# Patient Record
Sex: Male | Born: 1980
Health system: Southern US, Community
[De-identification: ages and names within clinical notes are randomized; demographics above are authoritative.]

---

## 2013-04-26 ENCOUNTER — Encounter (HOSPITAL_BASED_OUTPATIENT_CLINIC_OR_DEPARTMENT_OTHER): Payer: Self-pay | Admitting: *Deleted

## 2013-04-26 ENCOUNTER — Emergency Department (HOSPITAL_BASED_OUTPATIENT_CLINIC_OR_DEPARTMENT_OTHER)
Admission: EM | Admit: 2013-04-26 | Discharge: 2013-04-26 | Disposition: A | Discharge status: Home / Self Care | Payer: Self-pay | Attending: Emergency Medicine | Admitting: Emergency Medicine

## 2013-04-26 DIAGNOSIS — S81809A Unspecified open wound, unspecified lower leg, initial encounter: Secondary | ICD-10-CM | POA: Insufficient documentation

## 2013-04-26 DIAGNOSIS — S81811A Laceration without foreign body, right lower leg, initial encounter: Secondary | ICD-10-CM

## 2013-04-26 DIAGNOSIS — F17200 Nicotine dependence, unspecified, uncomplicated: Secondary | ICD-10-CM | POA: Insufficient documentation

## 2013-04-26 DIAGNOSIS — Y9289 Other specified places as the place of occurrence of the external cause: Secondary | ICD-10-CM | POA: Insufficient documentation

## 2013-04-26 DIAGNOSIS — S81009A Unspecified open wound, unspecified knee, initial encounter: Secondary | ICD-10-CM | POA: Insufficient documentation

## 2013-04-26 DIAGNOSIS — Y9389 Activity, other specified: Secondary | ICD-10-CM | POA: Insufficient documentation

## 2013-04-26 DIAGNOSIS — W2209XA Striking against other stationary object, initial encounter: Secondary | ICD-10-CM | POA: Insufficient documentation

## 2013-04-26 MED ORDER — CEPHALEXIN 250 MG PO CAPS
500.0000 mg | ORAL_CAPSULE | Freq: Once | ORAL | Status: AC
  Administered 2013-04-26: 500 mg via ORAL
  Filled 2013-04-26: qty 2

## 2013-04-26 MED ORDER — CEPHALEXIN 500 MG PO CAPS: 500.0000 mg | ORAL_CAPSULE | Freq: Four times a day (QID) | ORAL | Status: DC

## 2013-04-26 NOTE — ED Notes (Signed)
Pt states he cut his right lower leg on a pole that he was putting down in the ground for his dogs around 12 today. Bleeding controlled.

## 2013-04-26 NOTE — ED Provider Notes (Signed)
History    This chart was scribed for Charles B. Bernette Mayers, MD by Quintella Reichert, ED scribe.  This patient was seen in room MH01/MH01 and the patient's care was started at 9:38 PM.   CSN: [id number]  Arrival date & time 04/26/13  2056      Chief Complaint  Patient presents with  . Laceration     The history is provided by the patient. No language interpreter was used.    HPI Comments: Bill Crawford is a 32 y.o. male who presents to the Emergency Department complaining of a laceration to the right lower leg that pt sustained 7 hours ago when the area was cut on a pole that he was putting in the ground.  Pt states that he washed the wound and applied peroxide and antibiotic ointment to the wound shortly after it occurred.  He reports that the wound was bleeding after the injury but presently bleeding is controlled.  He reports mild pain in the area.  Pt denies numbness or tingling in area, discharge from the area, discoloration, fever, chills, weakness, or any other associated symptoms.  He states that his last tetanus shot was in 2012 or 2013.   History reviewed. No pertinent past medical history.  History reviewed. No pertinent past surgical history.  History reviewed. No pertinent family history.  History  Substance Use Topics  . Smoking status: Current Every Day Smoker  . Smokeless tobacco: Not on file  . Alcohol Use: No      Review of Systems A complete 10 system review of systems was obtained and all systems are negative except as noted in the HPI and PMH.    Allergies  Review of patient's allergies indicates no known allergies.  Home Medications  No current outpatient prescriptions on file.  BP 146/63  Pulse 96  Temp(Src) 98.7 F (37.1 C) (Oral)  Resp 18  Ht 6\' 3"  (1.905 m)  Wt 200 lb (90.719 kg)  BMI 25 kg/m2  SpO2 98%  Physical Exam  Nursing note and vitals reviewed. Constitutional: He is oriented to person, place, and time. He appears well-developed  and well-nourished.  HENT:  Head: Normocephalic and atraumatic.  Eyes: Conjunctivae and EOM are normal.  Neck: Normal range of motion. Neck supple.  Cardiovascular: Normal rate and regular rhythm.   Pulmonary/Chest: Effort normal. No respiratory distress.  Neurological: He is alert and oriented to person, place, and time. No cranial nerve deficit.  Skin: Skin is warm and dry. No rash noted.  4-cm stellate laceration to right lower leg  Psychiatric: He has a normal mood and affect. His behavior is normal.    ED Course  Procedures (including critical care time)  DIAGNOSTIC STUDIES: Oxygen Saturation is 98% on room air, normal by my interpretation.    COORDINATION OF CARE: 9:42 PM-Discussed treatment plan which includes laceration repair with pt at bedside and pt agreed to plan.    LACERATION REPAIR PROCEDURE NOTE The patient's identification was confirmed and consent was obtained. This procedure was performed by Leonette Most B. Bernette Mayers, MD at 9:43 PM. Site: Right lower leg Sterile procedures observed Anesthetic used (type and amt): Lidocaine 2% w/o epinephrine Suture type/size:4-0 nylon Length:4 cm # of Sutures: 5 Technique: Loose approximation Complexity: Complex stellate Antibx ointment applied Tetanus UTD Site anesthetized, irrigated with NS, explored without evidence of foreign body, wound well approximated, site covered with dry, sterile dressing.  Patient tolerated procedure well without complications. Instructions for care discussed verbally and patient provided with additional written  instructions for homecare and f/u.  9:52 PM: Explained home treatment plan including keeping area clean and covered, applying antibiotioc ointment, and F/U in 10-14 days at urgent care, primary care, or ED to have sutures removed.  Advised pt not to apply peroxide.  Pt expressed understanding and agreed to plan.  Labs Reviewed - No data to display No results found.   1. Laceration of  right lower leg, initial encounter       MDM  Stellate laceration to R leg, >6hrs duration.Loosely approximated, start Abx. Advised to return for signs of infection. Suture removal in 10-14 days.       I personally performed the services described in this documentation, which was scribed in my presence. The recorded information has been reviewed and is accurate.     Charles B. Bernette Mayers, MD 04/26/13 2202

## 2013-04-26 NOTE — ED Notes (Signed)
MD at bedside. 

## 2013-05-07 ENCOUNTER — Encounter (HOSPITAL_BASED_OUTPATIENT_CLINIC_OR_DEPARTMENT_OTHER): Payer: Self-pay | Admitting: *Deleted

## 2013-05-07 ENCOUNTER — Emergency Department (HOSPITAL_BASED_OUTPATIENT_CLINIC_OR_DEPARTMENT_OTHER)
Admission: EM | Admit: 2013-05-07 | Discharge: 2013-05-07 | Disposition: A | Discharge status: Home / Self Care | Payer: Self-pay | Attending: Emergency Medicine | Admitting: Emergency Medicine

## 2013-05-07 DIAGNOSIS — F17200 Nicotine dependence, unspecified, uncomplicated: Secondary | ICD-10-CM | POA: Insufficient documentation

## 2013-05-07 DIAGNOSIS — Z4802 Encounter for removal of sutures: Secondary | ICD-10-CM

## 2013-05-07 NOTE — ED Provider Notes (Signed)
History     CSN: [id number]  Arrival date & time 05/07/13  1556   First MD Initiated Contact with Patient 05/07/13 [phone]      Chief Complaint  Patient presents with  . Suture / Staple Removal    (Consider location/radiation/quality/duration/timing/severity/associated sxs/prior treatment) HPI Comments: Patient presents for suture removal from right leg. Sutures placed on May 17. Denies any problems. No bleeding or drainage. No fevers or vomiting.  The history is provided by the patient.    History reviewed. No pertinent past medical history.  History reviewed. No pertinent past surgical history.  History reviewed. No pertinent family history.  History  Substance Use Topics  . Smoking status: Current Every Day Smoker  . Smokeless tobacco: Not on file  . Alcohol Use: No      Review of Systems  Skin: Positive for wound.  A complete 10 system review of systems was obtained and all systems are negative except as noted in the HPI and PMH.    Allergies  Review of patient's allergies indicates no known allergies.  Home Medications   Current Outpatient Rx  Name  Route  Sig  Dispense  Refill  . cephALEXin (KEFLEX) 500 MG capsule   Oral   Take 1 capsule (500 mg total) by mouth 4 (four) times daily.   28 capsule   0     BP 127/67  Pulse 82  Temp(Src) 98.1 F (36.7 C)  Resp 16  SpO2 98%  Physical Exam  Constitutional: He is oriented to person, place, and time. He appears well-developed and well-nourished. No distress.  HENT:  Head: Normocephalic and atraumatic.  Mouth/Throat: Oropharynx is clear and moist. No oropharyngeal exudate.  Eyes: Conjunctivae and EOM are normal. Pupils are equal, round, and reactive to light.  Neck: Normal range of motion. Neck supple.  Cardiovascular: Normal rate, regular rhythm and normal heart sounds.   Pulmonary/Chest: Effort normal and breath sounds normal. No respiratory distress.  Abdominal: Soft. There is no tenderness. There  is no rebound and no guarding.  Musculoskeletal: Normal range of motion. He exhibits no edema and no tenderness.  Neurological: He is alert and oriented to person, place, and time. No cranial nerve deficit. He exhibits normal muscle tone. Coordination normal.  Skin: Skin is warm.  Scabbed laceration to right shin. No fluctuance or erythema. No drainage. Sutures are removed by nursing staff    ED Course  Procedures (including critical care time)  Labs Reviewed - No data to display No results found.   1. Visit for suture removal       MDM  Suture removal without complication. Patient informed that he will have a scar.       Glynn Octave, MD 05/07/13 [phone]

## 2013-05-07 NOTE — ED Notes (Signed)
Pt here for suture removal last seen 5/17

## 2013-05-13 ENCOUNTER — Emergency Department (HOSPITAL_BASED_OUTPATIENT_CLINIC_OR_DEPARTMENT_OTHER): Payer: Self-pay

## 2013-05-13 ENCOUNTER — Emergency Department (HOSPITAL_BASED_OUTPATIENT_CLINIC_OR_DEPARTMENT_OTHER)
Admission: EM | Admit: 2013-05-13 | Discharge: 2013-05-13 | Disposition: A | Discharge status: Home Health | Payer: Self-pay | Attending: Emergency Medicine | Admitting: Emergency Medicine

## 2013-05-13 DIAGNOSIS — R209 Unspecified disturbances of skin sensation: Secondary | ICD-10-CM | POA: Insufficient documentation

## 2013-05-13 DIAGNOSIS — R200 Anesthesia of skin: Secondary | ICD-10-CM

## 2013-05-13 DIAGNOSIS — F17200 Nicotine dependence, unspecified, uncomplicated: Secondary | ICD-10-CM | POA: Insufficient documentation

## 2013-05-13 DIAGNOSIS — IMO0001 Reserved for inherently not codable concepts without codable children: Secondary | ICD-10-CM | POA: Insufficient documentation

## 2013-05-13 MED ORDER — HYDROCODONE-ACETAMINOPHEN 5-325 MG PO TABS: 2.0000 | ORAL_TABLET | ORAL | Status: DC | PRN

## 2013-05-13 MED ORDER — IBUPROFEN 800 MG PO TABS: 800.0000 mg | ORAL_TABLET | Freq: Three times a day (TID) | ORAL | Status: DC

## 2013-05-13 NOTE — ED Notes (Signed)
Pt neuroloigcally intact with exception of numbness to fingertips of hand.

## 2013-05-13 NOTE — ED Provider Notes (Signed)
Medical screening examination/treatment/procedure(s) were conducted as a shared visit with non-physician practitioner(s) and myself.  I personally evaluated the patient during the encounter  Pt with numbness, tingling and some occasional pain worse with flexion of left wrist. Began mildly yesterday, worse today.  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  Distribution is nto classic, but seems consistent by history and current exam to be carpal tunnel.  Doubt upper cervical lesion, disc herniation, certainly not a CVA.  Rest of exam is non focal.  RICE Wrist brace Hand referrral  Gavin Pound. Cally Nygard, MD 05/13/13 1414

## 2013-05-13 NOTE — ED Provider Notes (Signed)
History     CSN: [id number]  Arrival date & time 05/13/13  1121   First MD Initiated Contact with Patient 05/13/13 1213      Chief Complaint  Patient presents with  . Numbness    (Consider location/radiation/quality/duration/timing/severity/associated sxs/prior treatment) Patient is a 32 y.o. male presenting with hand pain. The history is provided by the patient. No language interpreter was used.  Hand Pain This is a new problem. The current episode started today. The problem occurs constantly. The problem has been unchanged. Associated symptoms include myalgias. Pertinent negatives include no joint swelling. Nothing aggravates the symptoms. He has tried nothing for the symptoms. The treatment provided no relief.  Pt complains of numbness and tingling to his left hand.  Pt installs upholstry in school buses.   Pt does repetitious work.   Pt reports numbness is worst in thumb and little finger   No past medical history on file.  No past surgical history on file.  No family history on file.  History  Substance Use Topics  . Smoking status: Current Every Day Smoker  . Smokeless tobacco: Not on file  . Alcohol Use: No      Review of Systems  Musculoskeletal: Positive for myalgias. Negative for joint swelling.  All other systems reviewed and are negative.    Allergies  Review of patient's allergies indicates no known allergies.  Home Medications   Current Outpatient Rx  Name  Route  Sig  Dispense  Refill  . cephALEXin (KEFLEX) 500 MG capsule   Oral   Take 1 capsule (500 mg total) by mouth 4 (four) times daily.   28 capsule   0     BP 132/65  Pulse 71  Temp(Src) 97.8 F (36.6 C) (Oral)  Resp 20  SpO2 100%  Physical Exam  Nursing note and vitals reviewed. Constitutional: He is oriented to person, place, and time. He appears well-developed and well-nourished.  HENT:  Head: Normocephalic and atraumatic.  Eyes: Pupils are equal, round, and reactive to light.   Neck: Normal range of motion.  Cardiovascular: Normal rate and normal heart sounds.   Pulmonary/Chest: Effort normal.  Musculoskeletal: Normal range of motion.  Neurological: He is alert and oriented to person, place, and time. He has normal reflexes.  Skin: Skin is warm.  Psychiatric: He has a normal mood and affect.    ED Course  Procedures (including critical care time)  Labs Reviewed - No data to display Dg Cervical Spine Complete  05/13/2013   *RADIOLOGY REPORT*  Clinical Data: Left hand numbness for a few days.  Occasional left- sided neck pain.  No known injury.  CERVICAL SPINE - COMPLETE 4+ VIEW  Comparison: 03/29/2009  Findings: There is normal alignment of the cervical spine.  There is no evidence for acute fracture or dislocation.  Prevertebral soft tissues have a normal appearance.  Lung apices have a normal appearance.  IMPRESSION: Negative exam.   Original Report Authenticated By: Norva Pavlov, M.D.     No diagnosis found.    MDM  Pt advised to follow up with Dr. Amanda Pea for evaluation.  Pt placed in a wrist splint,  Ibuprofen and hydrocodone for discomfort        Elson Areas, PA-C 05/13/13 1402

## 2013-05-13 NOTE — ED Notes (Signed)
Pt sts numbness to left finger tips with sharp pain shooting up medial forearm; started this morning at 0530 while at work. Denies all other complaints, denies any recent medications, denies PMH, home meds. Pt is otherwise neurologically negative on assessment.

## 2013-07-09 ENCOUNTER — Emergency Department (HOSPITAL_BASED_OUTPATIENT_CLINIC_OR_DEPARTMENT_OTHER)
Admission: EM | Admit: 2013-07-09 | Discharge: 2013-07-09 | Disposition: A | Discharge status: Home / Self Care | Payer: Self-pay | Attending: Emergency Medicine | Admitting: Emergency Medicine

## 2013-07-09 ENCOUNTER — Encounter (HOSPITAL_BASED_OUTPATIENT_CLINIC_OR_DEPARTMENT_OTHER): Payer: Self-pay | Admitting: *Deleted

## 2013-07-09 DIAGNOSIS — B349 Viral infection, unspecified: Secondary | ICD-10-CM

## 2013-07-09 DIAGNOSIS — F17200 Nicotine dependence, unspecified, uncomplicated: Secondary | ICD-10-CM | POA: Insufficient documentation

## 2013-07-09 DIAGNOSIS — B9789 Other viral agents as the cause of diseases classified elsewhere: Secondary | ICD-10-CM | POA: Insufficient documentation

## 2013-07-09 LAB — CBC WITH DIFFERENTIAL/PLATELET
Basophils Absolute: 0 10*3/uL (ref 0.0–0.1)
Basophils Relative: 1 % (ref 0–1)
Eosinophils Absolute: 0.2 10*3/uL (ref 0.0–0.7)
Eosinophils Relative: 4 % (ref 0–5)
HCT: 47.3 % (ref 39.0–52.0)
MCH: 32.1 pg (ref 26.0–34.0)
MCHC: 33.8 g/dL (ref 30.0–36.0)
MCV: 95 fL (ref 78.0–100.0)
Monocytes Absolute: 0.4 10*3/uL (ref 0.1–1.0)
Platelets: 231 10*3/uL (ref 150–400)
RDW: 13.6 % (ref 11.5–15.5)
WBC: 6.3 10*3/uL (ref 4.0–10.5)

## 2013-07-09 LAB — BASIC METABOLIC PANEL
CO2: 28 mEq/L (ref 19–32)
Calcium: 10.1 mg/dL (ref 8.4–10.5)
Creatinine, Ser: 1.2 mg/dL (ref 0.50–1.35)
GFR calc non Af Amer: 80 mL/min — ABNORMAL LOW (ref >90)
Sodium: 142 mEq/L (ref 135–145)

## 2013-07-09 NOTE — ED Provider Notes (Signed)
  CSN: [id number]     Arrival date & time 07/09/13  1044 History     First MD Initiated Contact with Patient 07/09/13 1100     Chief Complaint  Patient presents with  . Sore Throat   HPI Pt amb to room 8 with quick steady gait in nad. Pt reports sore throat x 3 days with pain swallowing. Pt also reports intermittent cough  History reviewed. No pertinent past medical history. History reviewed. No pertinent past surgical history. History reviewed. No pertinent family history. History  Substance Use Topics  . Smoking status: Current Every Day Smoker  . Smokeless tobacco: Not on file  . Alcohol Use: No    Review of Systems All other systems reviewed and are negative Allergies  Review of patient's allergies indicates no known allergies.  Home Medications   Current Outpatient Rx  Name  Route  Sig  Dispense  Refill  . cephALEXin (KEFLEX) 500 MG capsule   Oral   Take 1 capsule (500 mg total) by mouth 4 (four) times daily.   28 capsule   0   . HYDROcodone-acetaminophen (NORCO/VICODIN) 5-325 MG per tablet   Oral   Take 2 tablets by mouth every 4 (four) hours as needed for pain.   10 tablet   0   . ibuprofen (ADVIL,MOTRIN) 800 MG tablet   Oral   Take 1 tablet (800 mg total) by mouth 3 (three) times daily.   21 tablet   0    BP 128/72  Temp(Src) 98.6 F (37 C) (Oral)  Resp 14  Ht 6\' 3"  (1.905 m)  Wt 205 lb (92.987 kg)  BMI 25.62 kg/m2  SpO2 100% Physical Exam  Nursing note and vitals reviewed. Constitutional: He is oriented to person, place, and time. He appears well-developed and well-nourished. No distress.  HENT:  Head: Normocephalic and atraumatic.  Mouth/Throat: Uvula is midline and mucous membranes are normal. Posterior oropharyngeal erythema present. No oropharyngeal exudate or tonsillar abscesses.  Eyes: Pupils are equal, round, and reactive to light.  Neck: Normal range of motion.  Cardiovascular: Normal rate and intact distal pulses.   Pulmonary/Chest:  No respiratory distress.  Abdominal: Normal appearance. He exhibits no distension.  Musculoskeletal: Normal range of motion.  Neurological: He is alert and oriented to person, place, and time. No cranial nerve deficit.  Skin: Skin is warm and dry. No rash noted.  Psychiatric: He has a normal mood and affect. His behavior is normal.    ED Course   Procedures (including critical care time)  Labs Reviewed  BASIC METABOLIC PANEL - Abnormal; Notable for the following:    Glucose, Bld 144 (*)    GFR calc non Af Amer 80 (*)    All other components within normal limits  RAPID STREP SCREEN  CULTURE, GROUP A STREP  CBC WITH DIFFERENTIAL   No results found. 1. Viral illness     MDM    Nelia Shi, MD 07/09/13 [phone]

## 2013-07-09 NOTE — ED Notes (Signed)
Pt amb to room 8 with quick steady gait in nad. Pt reports sore throat x 3 days with pain swallowing. Pt also reports intermittent cough.

## 2013-07-11 LAB — CULTURE, GROUP A STREP

## 2013-08-06 ENCOUNTER — Encounter (HOSPITAL_BASED_OUTPATIENT_CLINIC_OR_DEPARTMENT_OTHER): Payer: Self-pay | Admitting: *Deleted

## 2013-08-06 ENCOUNTER — Emergency Department (HOSPITAL_BASED_OUTPATIENT_CLINIC_OR_DEPARTMENT_OTHER)
Admission: EM | Admit: 2013-08-06 | Discharge: 2013-08-06 | Disposition: A | Discharge status: Home / Self Care | Payer: Self-pay | Attending: Emergency Medicine | Admitting: Emergency Medicine

## 2013-08-06 DIAGNOSIS — R509 Fever, unspecified: Secondary | ICD-10-CM | POA: Insufficient documentation

## 2013-08-06 DIAGNOSIS — J069 Acute upper respiratory infection, unspecified: Secondary | ICD-10-CM | POA: Insufficient documentation

## 2013-08-06 DIAGNOSIS — R062 Wheezing: Secondary | ICD-10-CM | POA: Insufficient documentation

## 2013-08-06 DIAGNOSIS — J3489 Other specified disorders of nose and nasal sinuses: Secondary | ICD-10-CM | POA: Insufficient documentation

## 2013-08-06 DIAGNOSIS — J40 Bronchitis, not specified as acute or chronic: Secondary | ICD-10-CM | POA: Insufficient documentation

## 2013-08-06 DIAGNOSIS — F17200 Nicotine dependence, unspecified, uncomplicated: Secondary | ICD-10-CM | POA: Insufficient documentation

## 2013-08-06 DIAGNOSIS — J029 Acute pharyngitis, unspecified: Secondary | ICD-10-CM | POA: Insufficient documentation

## 2013-08-06 MED ORDER — ALBUTEROL SULFATE HFA 108 (90 BASE) MCG/ACT IN AERS
1.0000 | INHALATION_SPRAY | RESPIRATORY_TRACT | Status: DC | PRN
  Administered 2013-08-06: 2 via RESPIRATORY_TRACT
  Filled 2013-08-06: qty 6.7

## 2013-08-06 MED ORDER — HYDROCODONE-ACETAMINOPHEN 7.5-325 MG/15ML PO SOLN
15.0000 mL | Freq: Four times a day (QID) | ORAL | Status: DC | PRN
Start: 2013-08-06 — End: 2014-04-29

## 2013-08-06 NOTE — ED Provider Notes (Addendum)
CSN: [id number]     Arrival date & time 08/06/13  4098 History   First MD Initiated Contact with Patient 08/06/13 [phone]     Chief Complaint  Patient presents with  . Cough   (Consider location/radiation/quality/duration/timing/severity/associated sxs/prior Treatment) Patient is a 32 y.o. male presenting with cough. The history is provided by the patient.  Cough Cough characteristics:  Productive and hacking Sputum characteristics:  White Severity:  Moderate Onset quality:  Gradual Duration:  1 week Timing:  Constant Progression:  Unchanged Chronicity:  New Smoker: yes   Context: sick contacts and upper respiratory infection   Relieved by:  Nothing Worsened by:  Deep breathing, lying down and smoking Associated symptoms: rhinorrhea, sinus congestion, sore throat and wheezing   Associated symptoms: no chest pain, no ear pain and no shortness of breath   Associated symptoms comment:  Fever only first 2 days and none since Risk factors: no recent infection and no recent travel     History reviewed. No pertinent past medical history. History reviewed. No pertinent past surgical history. History reviewed. No pertinent family history. History  Substance Use Topics  . Smoking status: Current Every Day Smoker  . Smokeless tobacco: Not on file  . Alcohol Use: No    Review of Systems  HENT: Positive for sore throat and rhinorrhea. Negative for ear pain.   Respiratory: Positive for cough and wheezing. Negative for shortness of breath.   Cardiovascular: Negative for chest pain.  All other systems reviewed and are negative.    Allergies  Review of patient's allergies indicates no known allergies.  Home Medications   Current Outpatient Rx  Name  Route  Sig  Dispense  Refill  . cephALEXin (KEFLEX) 500 MG capsule   Oral   Take 1 capsule (500 mg total) by mouth 4 (four) times daily.   28 capsule   0   . HYDROcodone-acetaminophen (NORCO/VICODIN) 5-325 MG per tablet   Oral  Take 2 tablets by mouth every 4 (four) hours as needed for pain.   10 tablet   0   . ibuprofen (ADVIL,MOTRIN) 800 MG tablet   Oral   Take 1 tablet (800 mg total) by mouth 3 (three) times daily.   21 tablet   0    BP 139/68  Pulse 66  Temp(Src) 97.9 F (36.6 C) (Oral)  Resp 18  SpO2 100% Physical Exam  Nursing note and vitals reviewed. Constitutional: He is oriented to person, place, and time. He appears well-developed and well-nourished. No distress.  HENT:  Head: Normocephalic and atraumatic.  Right Ear: Tympanic membrane and ear canal normal.  Left Ear: Tympanic membrane and ear canal normal.  Nose: Mucosal edema and rhinorrhea present.  Mouth/Throat: Mucous membranes are normal. Posterior oropharyngeal erythema present. No oropharyngeal exudate, posterior oropharyngeal edema or tonsillar abscesses.  Eyes: Conjunctivae and EOM are normal. Pupils are equal, round, and reactive to light.  Neck: Normal range of motion. Neck supple.  Cardiovascular: Normal rate, regular rhythm and intact distal pulses.   No murmur heard. Pulmonary/Chest: Effort normal and breath sounds normal. No respiratory distress. He has no wheezes. He has no rales.  Abdominal: Soft. He exhibits no distension. There is no tenderness. There is no rebound and no guarding.  Musculoskeletal: Normal range of motion. He exhibits no edema and no tenderness.  Neurological: He is alert and oriented to person, place, and time.  Skin: Skin is warm and dry. No rash noted. No erythema.  Psychiatric: He has a normal mood  and affect. His behavior is normal.    ED Course  Procedures (including critical care time) Labs Review Labs Reviewed - No data to display Imaging Review No results found.  MDM   1. Bronchitis      Pt with symptoms consistent with viral URI/bronchitis.  Well appearing here.  No signs of breathing difficulty  No signs of pharyngitis, otitis or abnormal abdominal findings.   Clear lungs on exam  and do not feel pt need CXR at this time.  Will treat with albuterol and cough suppressant.  To return if sx worsen.     Gwyneth Sprout, MD 08/06/13 9147  Gwyneth Sprout, MD 08/06/13 [phone]

## 2013-08-06 NOTE — ED Notes (Signed)
Pt amb to room 11 with quick steady gait in nad. Pt reports "naggy" cough x 1 week. States he had to leave work early yesterday "because it was so bad" went home and rested, but still feels bad today.

## 2014-02-02 ENCOUNTER — Emergency Department (HOSPITAL_BASED_OUTPATIENT_CLINIC_OR_DEPARTMENT_OTHER)
Admission: EM | Admit: 2014-02-02 | Discharge: 2014-02-02 | Disposition: A | Discharge status: Home / Self Care | Payer: PRIVATE HEALTH INSURANCE | Attending: Emergency Medicine | Admitting: Emergency Medicine

## 2014-02-02 ENCOUNTER — Encounter (HOSPITAL_BASED_OUTPATIENT_CLINIC_OR_DEPARTMENT_OTHER): Payer: Self-pay | Admitting: Emergency Medicine

## 2014-02-02 DIAGNOSIS — R109 Unspecified abdominal pain: Secondary | ICD-10-CM | POA: Insufficient documentation

## 2014-02-02 DIAGNOSIS — M549 Dorsalgia, unspecified: Secondary | ICD-10-CM

## 2014-02-02 DIAGNOSIS — M545 Low back pain, unspecified: Secondary | ICD-10-CM | POA: Insufficient documentation

## 2014-02-02 DIAGNOSIS — F17200 Nicotine dependence, unspecified, uncomplicated: Secondary | ICD-10-CM | POA: Insufficient documentation

## 2014-02-02 DIAGNOSIS — Z792 Long term (current) use of antibiotics: Secondary | ICD-10-CM | POA: Insufficient documentation

## 2014-02-02 MED ORDER — IBUPROFEN 800 MG PO TABS: 800.0000 mg | ORAL_TABLET | Freq: Three times a day (TID) | ORAL | Status: AC

## 2014-02-02 MED ORDER — DIAZEPAM 5 MG PO TABS: 5.0000 mg | ORAL_TABLET | Freq: Two times a day (BID) | ORAL | Status: DC

## 2014-02-02 NOTE — ED Provider Notes (Signed)
CSN: [id number]     Arrival date & time 02/02/14  1408 History  This chart was scribed for Gerhard Munch, MD by Donne Anon, ED Scribe. This patient was seen in room MH07/MH07 and the patient's care was started at 1646.    First MD Initiated Contact with Patient 02/02/14 1546     Chief Complaint  Patient presents with  . Back Pain     The history is provided by the patient. No language interpreter was used.   HPI Comments: Bill Crawford. is a 33 y.o. male who presents to the Emergency Department complaining of bilateral lower back pain that began 2 days ago and is localized over his SI joint area described as throbbing. He denies recent falls, strenuous activity or trauma. He reports a long history of back spasms, but states this pain is different. He reports associated mild abdominal pain described as pressure, which has not been present in previous back spasm episodes. He has tried ibuprofen and gentle stretching with little relief. He reports he is otherwise healthy and denies a hx of back surgery. He is a current everyday smoker but is trying to quit. He denies fever, dysuria, hematuria, increased frequency, decreased urine output, numbness or weakness in his extremities, difficulty balancing or any other symptoms.   History reviewed. No pertinent past medical history. History reviewed. No pertinent past surgical history. No family history on file. History  Substance Use Topics  . Smoking status: Current Every Day Smoker  . Smokeless tobacco: Not on file  . Alcohol Use: No    Review of Systems  Constitutional:       Per HPI, otherwise negative  HENT:       Per HPI, otherwise negative  Respiratory:       Per HPI, otherwise negative  Cardiovascular:       Per HPI, otherwise negative  Gastrointestinal: Negative for vomiting.  Endocrine:       Negative aside from HPI  Genitourinary:       Neg aside from HPI   Musculoskeletal:       Per HPI, otherwise negative  Skin:  Negative.   Neurological: Negative for syncope.    Allergies  Review of patient's allergies indicates no known allergies.  Home Medications   Current Outpatient Rx  Name  Route  Sig  Dispense  Refill  . cephALEXin (KEFLEX) 500 MG capsule   Oral   Take 1 capsule (500 mg total) by mouth 4 (four) times daily.   28 capsule   0   . HYDROcodone-acetaminophen (HYCET) 7.5-325 mg/15 ml solution   Oral   Take 15 mLs by mouth 4 (four) times daily as needed for pain or cough.   120 mL   0   . HYDROcodone-acetaminophen (NORCO/VICODIN) 5-325 MG per tablet   Oral   Take 2 tablets by mouth every 4 (four) hours as needed for pain.   10 tablet   0   . ibuprofen (ADVIL,MOTRIN) 800 MG tablet   Oral   Take 1 tablet (800 mg total) by mouth 3 (three) times daily.   21 tablet   0    BP 126/75  Pulse 80  Temp(Src) 98.7 F (37.1 C) (Oral)  Ht 6\' 3"  (1.905 m)  Wt 203 lb (92.08 kg)  BMI 25.37 kg/m2  SpO2 99%  Physical Exam  Nursing note and vitals reviewed. Constitutional: He is oriented to person, place, and time. He appears well-developed. No distress.  HENT:  Head: Normocephalic and  atraumatic.  Eyes: Conjunctivae and EOM are normal.  Cardiovascular: Normal rate and regular rhythm.   Pulmonary/Chest: Effort normal. No stridor. No respiratory distress.  Abdominal: He exhibits no distension.  Musculoskeletal: He exhibits no edema.  No deformity. Mild tenderness in lower lumbar spine. Pain with resistance with hip flexion on right, not on left. Negative passive straight leg test.   Neurological: He is alert and oriented to person, place, and time.  Skin: Skin is warm and dry.  Psychiatric: He has a normal mood and affect.    ED Course  Procedures (including critical care time) COORDINATION OF CARE: 3:54 PM Discussed treatment plan with pt at bedside and pt agreed to plan. Advised pt to rest for 2 days, will give note for work. Will discharge home with 800 mg ibuprofen tablets and  5 mg Valium tablets.   MDM   Final diagnoses:  Back pain   Patient presents with back pain.  No neurologic deficits, no red lines, no abnormal vital signs.  The patient was discharged in stable condition.  Gerhard Munchobert Rodolph Hagemann, MD 02/02/14 2350

## 2014-02-02 NOTE — Discharge Instructions (Signed)
As discussed, your evaluation today has been largely reassuring.  But, it is important that you monitor your condition carefully, and do not hesitate to return to the ED if you develop new, or concerning changes in your condition. ? ?Otherwise, please follow-up with your physician for appropriate ongoing care. ? ?

## 2014-02-02 NOTE — ED Notes (Signed)
Pt c/o low back x 2 days.

## 2014-04-29 ENCOUNTER — Encounter (HOSPITAL_BASED_OUTPATIENT_CLINIC_OR_DEPARTMENT_OTHER): Payer: Self-pay | Admitting: Emergency Medicine

## 2014-04-29 ENCOUNTER — Emergency Department (HOSPITAL_BASED_OUTPATIENT_CLINIC_OR_DEPARTMENT_OTHER): Payer: PRIVATE HEALTH INSURANCE

## 2014-04-29 ENCOUNTER — Emergency Department (HOSPITAL_BASED_OUTPATIENT_CLINIC_OR_DEPARTMENT_OTHER)
Admission: EM | Admit: 2014-04-29 | Discharge: 2014-04-29 | Disposition: A | Discharge status: Home / Self Care | Payer: PRIVATE HEALTH INSURANCE | Attending: Emergency Medicine | Admitting: Emergency Medicine

## 2014-04-29 DIAGNOSIS — R630 Anorexia: Secondary | ICD-10-CM | POA: Insufficient documentation

## 2014-04-29 DIAGNOSIS — Z791 Long term (current) use of non-steroidal anti-inflammatories (NSAID): Secondary | ICD-10-CM | POA: Insufficient documentation

## 2014-04-29 DIAGNOSIS — R1110 Vomiting, unspecified: Secondary | ICD-10-CM

## 2014-04-29 DIAGNOSIS — R1031 Right lower quadrant pain: Secondary | ICD-10-CM | POA: Insufficient documentation

## 2014-04-29 DIAGNOSIS — R197 Diarrhea, unspecified: Secondary | ICD-10-CM | POA: Insufficient documentation

## 2014-04-29 DIAGNOSIS — F17200 Nicotine dependence, unspecified, uncomplicated: Secondary | ICD-10-CM | POA: Insufficient documentation

## 2014-04-29 LAB — COMPREHENSIVE METABOLIC PANEL
ALBUMIN: 4.1 g/dL (ref 3.5–5.2)
ALK PHOS: 69 U/L (ref 39–117)
ALT: 24 U/L (ref 0–53)
AST: 20 U/L (ref 0–37)
BUN: 11 mg/dL (ref 6–23)
CO2: 26 mEq/L (ref 19–32)
Calcium: 10 mg/dL (ref 8.4–10.5)
Chloride: 103 mEq/L (ref 96–112)
Creatinine, Ser: 1.1 mg/dL (ref 0.50–1.35)
GFR calc Af Amer: >90 mL/min (ref >90)
GFR calc non Af Amer: 88 mL/min — ABNORMAL LOW (ref >90)
Glucose, Bld: 92 mg/dL (ref 70–99)
POTASSIUM: 4.2 meq/L (ref 3.7–5.3)
SODIUM: 143 meq/L (ref 137–147)
TOTAL PROTEIN: 7.1 g/dL (ref 6.0–8.3)
Total Bilirubin: 0.8 mg/dL (ref 0.3–1.2)

## 2014-04-29 LAB — URINALYSIS, ROUTINE W REFLEX MICROSCOPIC
BILIRUBIN URINE: NEGATIVE
Glucose, UA: NEGATIVE mg/dL
Hgb urine dipstick: NEGATIVE
Ketones, ur: NEGATIVE mg/dL
Leukocytes, UA: NEGATIVE
NITRITE: NEGATIVE
Protein, ur: NEGATIVE mg/dL
SPECIFIC GRAVITY, URINE: 1.006 (ref 1.005–1.030)
UROBILINOGEN UA: 0.2 mg/dL (ref 0.0–1.0)
pH: 6 (ref 5.0–8.0)

## 2014-04-29 LAB — CBC WITH DIFFERENTIAL/PLATELET
BASOS ABS: 0 10*3/uL (ref 0.0–0.1)
BASOS PCT: 0 % (ref 0–1)
EOS ABS: 0.3 10*3/uL (ref 0.0–0.7)
Eosinophils Relative: 4 % (ref 0–5)
HCT: 44 % (ref 39.0–52.0)
Hemoglobin: 15.4 g/dL (ref 13.0–17.0)
LYMPHS ABS: 2.7 10*3/uL (ref 0.7–4.0)
Lymphocytes Relative: 38 % (ref 12–46)
MCH: 32.8 pg (ref 26.0–34.0)
MCHC: 35 g/dL (ref 30.0–36.0)
MCV: 93.8 fL (ref 78.0–100.0)
Monocytes Absolute: 0.8 10*3/uL (ref 0.1–1.0)
Monocytes Relative: 11 % (ref 3–12)
NEUTROS PCT: 47 % (ref 43–77)
Neutro Abs: 3.3 10*3/uL (ref 1.7–7.7)
PLATELETS: 237 10*3/uL (ref 150–400)
RBC: 4.69 MIL/uL (ref 4.22–5.81)
RDW: 12.9 % (ref 11.5–15.5)
WBC: 7 10*3/uL (ref 4.0–10.5)

## 2014-04-29 LAB — LIPASE, BLOOD: Lipase: 15 U/L (ref 11–59)

## 2014-04-29 MED ORDER — IOHEXOL 300 MG/ML  SOLN
50.0000 mL | Freq: Once | INTRAMUSCULAR | Status: AC | PRN
  Administered 2014-04-29: 50 mL via ORAL

## 2014-04-29 MED ORDER — DIPHENOXYLATE-ATROPINE 2.5-0.025 MG PO TABS: 2.0000 | ORAL_TABLET | Freq: Four times a day (QID) | ORAL | Status: DC | PRN

## 2014-04-29 MED ORDER — PROMETHAZINE HCL 25 MG PO TABS: 25.0000 mg | ORAL_TABLET | Freq: Four times a day (QID) | ORAL | Status: DC | PRN

## 2014-04-29 MED ORDER — SODIUM CHLORIDE 0.9 % IV SOLN
Freq: Once | INTRAVENOUS | Status: AC
  Administered 2014-04-29: 13:00:00 via INTRAVENOUS

## 2014-04-29 MED ORDER — IOHEXOL 300 MG/ML  SOLN
100.0000 mL | Freq: Once | INTRAMUSCULAR | Status: AC | PRN
  Administered 2014-04-29: 100 mL via INTRAVENOUS

## 2014-04-29 NOTE — ED Notes (Signed)
Pt having lower abdominal pain since Saturday.  Pt states he has noticed blood in his stool and now has watery diarrhea, coffee ground color. Some nausea.  No vomiting.  No known fever.

## 2014-04-29 NOTE — Discharge Instructions (Signed)
Nausea and Vomiting °Nausea is a sick feeling that often comes before throwing up (vomiting). Vomiting is a reflex where stomach contents come out of your mouth. Vomiting can cause severe loss of body fluids (dehydration). Children and elderly adults can become dehydrated quickly, especially if they also have diarrhea. Nausea and vomiting are symptoms of a condition or disease. It is important to find the cause of your symptoms. °CAUSES  °· Direct irritation of the stomach lining. This irritation can result from increased acid production (gastroesophageal reflux disease), infection, food poisoning, taking certain medicines (such as nonsteroidal anti-inflammatory drugs), alcohol use, or tobacco use. °· Signals from the brain. These signals could be caused by a headache, heat exposure, an inner ear disturbance, increased pressure in the brain from injury, infection, a tumor, or a concussion, pain, emotional stimulus, or metabolic problems. °· An obstruction in the gastrointestinal tract (bowel obstruction). °· Illnesses such as diabetes, hepatitis, gallbladder problems, appendicitis, kidney problems, cancer, sepsis, atypical symptoms of a heart attack, or eating disorders. °· Medical treatments such as chemotherapy and radiation. °· Receiving medicine that makes you sleep (general anesthetic) during surgery. °DIAGNOSIS °Your caregiver may ask for tests to be done if the problems do not improve after a few days. Tests may also be done if symptoms are severe or if the reason for the nausea and vomiting is not clear. Tests may include: °· Urine tests. °· Blood tests. °· Stool tests. °· Cultures (to look for evidence of infection). °· X-rays or other imaging studies. °Test results can help your caregiver make decisions about treatment or the need for additional tests. °TREATMENT °You need to stay well hydrated. Drink frequently but in small amounts. You may wish to drink water, sports drinks, clear broth, or eat frozen  ice pops or gelatin dessert to help stay hydrated. When you eat, eating slowly may help prevent nausea. There are also some antinausea medicines that may help prevent nausea. °HOME CARE INSTRUCTIONS  °· Take all medicine as directed by your caregiver. °· If you do not have an appetite, do not force yourself to eat. However, you must continue to drink fluids. °· If you have an appetite, eat a normal diet unless your caregiver tells you differently. °· Eat a variety of complex carbohydrates (rice, wheat, potatoes, bread), lean meats, yogurt, fruits, and vegetables. °· Avoid high-fat foods because they are more difficult to digest. °· Drink enough water and fluids to keep your urine clear or pale yellow. °· If you are dehydrated, ask your caregiver for specific rehydration instructions. Signs of dehydration may include: °· Severe thirst. °· Dry lips and mouth. °· Dizziness. °· Dark urine. °· Decreasing urine frequency and amount. °· Confusion. °· Rapid breathing or pulse. °SEEK IMMEDIATE MEDICAL CARE IF:  °· You have blood or brown flecks (like coffee grounds) in your vomit. °· You have black or bloody stools. °· You have a severe headache or stiff neck. °· You are confused. °· You have severe abdominal pain. °· You have chest pain or trouble breathing. °· You do not urinate at least once every 8 hours. °· You develop cold or clammy skin. °· You continue to vomit for longer than 24 to 48 hours. °· You have a fever. °MAKE SURE YOU:  °· Understand these instructions. °· Will watch your condition. °· Will get help right away if you are not doing well or get worse. °Document Released: 11/27/2005 Document Revised: 02/19/2012 Document Reviewed: 04/26/2011 °ExitCare® Patient Information ©2014 [employer]. ° °Diarrhea °Diarrhea is frequent   loose and watery bowel movements. It can cause you to feel weak and dehydrated. Dehydration can cause you to become tired and thirsty, have a dry mouth, and have decreased urination that  often is dark yellow. Diarrhea is a sign of another problem, most often an infection that will not last long. In most cases, diarrhea typically lasts 2 3 days. However, it can last longer if it is a sign of something more serious. It is important to treat your diarrhea as directed by your caregive to lessen or prevent future episodes of diarrhea. °CAUSES  °Some common causes include: °· Gastrointestinal infections caused by viruses, bacteria, or parasites. °· Food poisoning or food allergies. °· Certain medicines, such as antibiotics, chemotherapy, and laxatives. °· Artificial sweeteners and fructose. °· Digestive disorders. °HOME CARE INSTRUCTIONS °· Ensure adequate fluid intake (hydration): have 1 cup (8 oz) of fluid for each diarrhea episode. Avoid fluids that contain simple sugars or sports drinks, fruit juices, whole milk products, and sodas. Your urine should be clear or pale yellow if you are drinking enough fluids. Hydrate with an oral rehydration solution that you can purchase at pharmacies, retail stores, and online. You can prepare an oral rehydration solution at home by mixing the following ingredients together: °·   tsp table salt. °· ¾ tsp baking soda. °·  tsp salt substitute containing potassium chloride. °· 1  tablespoons sugar. °· 1 L (34 oz) of water. °· Certain foods and beverages may increase the speed at which food moves through the gastrointestinal (GI) tract. These foods and beverages should be avoided and include: °· Caffeinated and alcoholic beverages. °· High-fiber foods, such as raw fruits and vegetables, nuts, seeds, and whole grain breads and cereals. °· Foods and beverages sweetened with sugar alcohols, such as xylitol, sorbitol, and mannitol. °· Some foods may be well tolerated and may help thicken stool including: °· Starchy foods, such as rice, toast, pasta, low-sugar cereal, oatmeal, grits, baked potatoes, crackers, and bagels. °· Bananas. °· Applesauce. °· Add probiotic-rich foods  to help increase healthy bacteria in the GI tract, such as yogurt and fermented milk products. °· Wash your hands well after each diarrhea episode. °· Only take over-the-counter or prescription medicines as directed by your caregiver. °· Take a warm bath to relieve any burning or pain from frequent diarrhea episodes. °SEEK IMMEDIATE MEDICAL CARE IF:  °· You are unable to keep fluids down. °· You have persistent vomiting. °· You have blood in your stool, or your stools are black and tarry. °· You do not urinate in 6 8 hours, or there is only a small amount of very dark urine. °· You have abdominal pain that increases or localizes. °· You have weakness, dizziness, confusion, or lightheadedness. °· You have a severe headache. °· Your diarrhea gets worse or does not get better. °· You have a fever or persistent symptoms for more than 2 3 days. °· You have a fever and your symptoms suddenly get worse. °MAKE SURE YOU:  °· Understand these instructions. °· Will watch your condition. °· Will get help right away if you are not doing well or get worse. °Document Released: 11/17/2002 Document Revised: 11/13/2012 Document Reviewed: 08/04/2012 °ExitCare® Patient Information ©2014 [employer]. ° °

## 2014-04-29 NOTE — ED Provider Notes (Signed)
CSN: [id number]     Arrival date & time 04/29/14  1200 History   First MD Initiated Contact with Patient 04/29/14 1235     Chief Complaint  Patient presents with  . Abdominal Pain  . Rectal Bleeding     (Consider location/radiation/quality/duration/timing/severity/associated sxs/prior Treatment) Patient is a 33 y.o. male presenting with abdominal pain and hematochezia. The history is provided by the patient. No language interpreter was used.  Abdominal Pain Pain location:  RLQ and LLQ Pain quality: aching   Pain radiates to:  Does not radiate Pain severity:  Moderate Onset quality:  Gradual Duration:  5 days Timing:  Constant Progression:  Worsening Relieved by:  Nothing Worsened by:  Nothing tried Ineffective treatments:  None tried Associated symptoms: anorexia, diarrhea, hematochezia and vomiting   Risk factors: has not had multiple surgeries   Rectal Bleeding Associated symptoms: abdominal pain and vomiting     No past medical history on file. No past surgical history on file. No family history on file. History  Substance Use Topics  . Smoking status: Current Every Day Smoker  . Smokeless tobacco: Not on file  . Alcohol Use: No    Review of Systems  Gastrointestinal: Positive for vomiting, abdominal pain, diarrhea, hematochezia and anorexia.  All other systems reviewed and are negative.     Allergies  Review of patient's allergies indicates no known allergies.  Home Medications   Prior to Admission medications   Medication Sig Start Date End Date Taking? Authorizing Provider  ibuprofen (ADVIL,MOTRIN) 800 MG tablet Take 1 tablet (800 mg total) by mouth 3 (three) times daily. 02/02/14   Gerhard Munchobert Lockwood, MD   BP 120/57  Pulse 74  Temp(Src) 98.6 F (37 C) (Oral)  Resp 18  Ht 6\' 3"  (1.905 m)  Wt 200 lb (90.719 kg)  BMI 25.00 kg/m2  SpO2 100% Physical Exam  Nursing note and vitals reviewed. Constitutional: He appears well-developed.  HENT:  Head:  Normocephalic.  Right Ear: External ear normal.  Left Ear: External ear normal.  Eyes: Pupils are equal, round, and reactive to light.  Neck: Normal range of motion. Neck supple.  Cardiovascular: Normal rate and regular rhythm.   Pulmonary/Chest: Effort normal and breath sounds normal.  Abdominal: Soft. There is tenderness.  Musculoskeletal: Normal range of motion.  Neurological: He is alert.    ED Course  Procedures (including critical care time) Labs Review Labs Reviewed  COMPREHENSIVE METABOLIC PANEL - Abnormal; Notable for the following:    GFR calc non Af Amer 88 (*)    All other components within normal limits  CBC WITH DIFFERENTIAL  LIPASE, BLOOD  URINALYSIS, ROUTINE W REFLEX MICROSCOPIC    Imaging Review No results found.   EKG Interpretation None      MDM  Pt given dilaudid and zofran for nausea.   Pt reports feeling better,   Labs are normal.   Ct scan is normal.   Pt given resourse guide.  Rx for phenergan   Final diagnoses:  Diarrhea  Vomiting       Elson AreasLeslie K Vineeth Fell, PA-C 04/29/14 1558

## 2014-05-01 NOTE — ED Provider Notes (Signed)
Medical screening examination/treatment/procedure(s) were performed by non-physician practitioner and as supervising physician I was immediately available for consultation/collaboration.     Treshon Stannard, MD 05/01/14 1456 

## 2014-06-23 ENCOUNTER — Encounter (HOSPITAL_BASED_OUTPATIENT_CLINIC_OR_DEPARTMENT_OTHER): Payer: Self-pay | Admitting: Emergency Medicine

## 2014-06-23 ENCOUNTER — Emergency Department (HOSPITAL_BASED_OUTPATIENT_CLINIC_OR_DEPARTMENT_OTHER)
Admission: EM | Admit: 2014-06-23 | Discharge: 2014-06-23 | Disposition: A | Discharge status: Home / Self Care | Payer: PRIVATE HEALTH INSURANCE | Attending: Emergency Medicine | Admitting: Emergency Medicine

## 2014-06-23 DIAGNOSIS — Z79899 Other long term (current) drug therapy: Secondary | ICD-10-CM | POA: Insufficient documentation

## 2014-06-23 DIAGNOSIS — M545 Low back pain, unspecified: Secondary | ICD-10-CM | POA: Insufficient documentation

## 2014-06-23 DIAGNOSIS — L539 Erythematous condition, unspecified: Secondary | ICD-10-CM | POA: Insufficient documentation

## 2014-06-23 DIAGNOSIS — F17200 Nicotine dependence, unspecified, uncomplicated: Secondary | ICD-10-CM | POA: Insufficient documentation

## 2014-06-23 DIAGNOSIS — Z791 Long term (current) use of non-steroidal anti-inflammatories (NSAID): Secondary | ICD-10-CM | POA: Insufficient documentation

## 2014-06-23 MED ORDER — IBUPROFEN 800 MG PO TABS: 800.0000 mg | ORAL_TABLET | Freq: Three times a day (TID) | ORAL | Status: DC

## 2014-06-23 MED ORDER — HYDROCODONE-ACETAMINOPHEN 5-325 MG PO TABS: 2.0000 | ORAL_TABLET | ORAL | Status: DC | PRN

## 2014-06-23 MED ORDER — METHOCARBAMOL 500 MG PO TABS: 500.0000 mg | ORAL_TABLET | Freq: Two times a day (BID) | ORAL | Status: DC

## 2014-06-23 NOTE — Discharge Instructions (Signed)
Back Injury Prevention Back injuries can be extremely painful and difficult to heal. After having one back injury, you are much more likely to experience another later on. It is important to learn how to avoid injuring or re-injuring your back. The following tips can help you to prevent a back injury. PHYSICAL FITNESS  Exercise regularly and try to develop good tone in your abdominal muscles. Your abdominal muscles provide a lot of the support needed by your back.  Do aerobic exercises (walking, jogging, biking, swimming) regularly.  Do exercises that increase balance and strength (tai chi, yoga) regularly. This can decrease your risk of falling and injuring your back.  Stretch before and after exercising.  Maintain a healthy weight. The more you weigh, the more stress is placed on your back. For every pound of weight, 10 times that amount of pressure is placed on the back. DIET  Talk to your caregiver about how much calcium and vitamin D you need per day. These nutrients help to prevent weakening of the bones (osteoporosis). Osteoporosis can cause broken (fractured) bones that lead to back pain.  Include good sources of calcium in your diet, such as dairy products, green, leafy vegetables, and products with calcium added (fortified).  Include good sources of vitamin D in your diet, such as milk and foods that are fortified with vitamin D.  Consider taking a nutritional supplement or a multivitamin if needed.  Stop smoking if you smoke. POSTURE  Sit and stand up straight. Avoid leaning forward when you sit or hunching over when you stand.  Choose chairs with good low back (lumbar) support.  If you work at a desk, sit close to your work so you do not need to lean over. Keep your chin tucked in. Keep your neck drawn back and elbows bent at a right angle. Your arms should look like the letter "L."  Sit high and close to the steering wheel when you drive. Add a lumbar support to your car  seat if needed.  Avoid sitting or standing in one position for too long. Take breaks to get up, stretch, and walk around at least once every hour. Take breaks if you are driving for long periods of time.  Sleep on your side with your knees slightly bent, or sleep on your back with a pillow under your knees. Do not sleep on your stomach. LIFTING, TWISTING, AND REACHING  Avoid heavy lifting, especially repetitive lifting. If you must do heavy lifting:  Stretch before lifting.  Work slowly.  Rest between lifts.  Use carts and dollies to move objects when possible.  Make several small trips instead of carrying 1 heavy load.  Ask for help when you need it.  Ask for help when moving big, awkward objects.  Follow these steps when lifting:  Stand with your feet shoulder-width apart.  Get as close to the object as you can. Do not try to pick up heavy objects that are far from your body.  Use handles or lifting straps if they are available.  Bend at your knees. Squat down, but keep your heels off the floor.  Keep your shoulders pulled back, your chin tucked in, and your back straight.  Lift the object slowly, tightening the muscles in your legs, abdomen, and buttocks. Keep the object as close to the center of your body as possible.  When you put a load down, use these same guidelines in reverse.  Do not:  Lift the object above your waist.  Twist at the waist while lifting or carrying a load. Move your feet if you need to turn, not your waist.  Bend over without bending at your knees.  Avoid reaching over your head, across a table, or for an object on a high surface. OTHER TIPS  Avoid wet floors and keep sidewalks clear of ice to prevent falls.  Do not sleep on a mattress that is too soft or too hard.  Keep items that are used frequently within easy reach.  Put heavier objects on shelves at waist level and lighter objects on lower or higher shelves.  Find ways to  decrease your stress, such as exercise, massage, or relaxation techniques. Stress can build up in your muscles. Tense muscles are more vulnerable to injury.  Seek treatment for depression or anxiety if needed. These conditions can increase your risk of developing back pain. SEEK MEDICAL CARE IF:  You injure your back.  You have questions about diet, exercise, or other ways to prevent back injuries. MAKE SURE YOU:  Understand these instructions.  Will watch your condition.  Will get help right away if you are not doing well or get worse. Document Released: 01/04/2005 Document Revised: 02/19/2012 Document Reviewed: 01/08/2012 ExitCare Patient Information 2015 [employer]. This information is not intended to replace advice given to you by your health care provider. Make sure you discuss any questions you have with your health care provider.  Back Pain, Adult Low back pain is very common. About 1 in 5 people have back pain.The cause of low back pain is rarely dangerous. The pain often gets better over time.About half of people with a sudden onset of back pain feel better in just 2 weeks. About 8 in 10 people feel better by 6 weeks.  CAUSES Some common causes of back pain include:  Strain of the muscles or ligaments supporting the spine.  Wear and tear (degeneration) of the spinal discs.  Arthritis.  Direct injury to the back. DIAGNOSIS Most of the time, the direct cause of low back pain is not known.However, back pain can be treated effectively even when the exact cause of the pain is unknown.Answering your caregiver's questions about your overall health and symptoms is one of the most accurate ways to make sure the cause of your pain is not dangerous. If your caregiver needs more information, he or she may order lab work or imaging tests (X-rays or MRIs).However, even if imaging tests show changes in your back, this usually does not require surgery. HOME CARE INSTRUCTIONS For  many people, back pain returns.Since low back pain is rarely dangerous, it is often a condition that people can learn to manageon their own.   Remain active. It is stressful on the back to sit or stand in one place. Do not sit, drive, or stand in one place for more than 30 minutes at a time. Take short walks on level surfaces as soon as pain allows.Try to increase the length of time you walk each day.  Do not stay in bed.Resting more than 1 or 2 days can delay your recovery.  Do not avoid exercise or work.Your body is made to move.It is not dangerous to be active, even though your back may hurt.Your back will likely heal faster if you return to being active before your pain is gone.  Pay attention to your body when you bend and lift. Many people have less discomfortwhen lifting if they bend their knees, keep the load close to their bodies,and   twisting. Often, the most comfortable positions are those that put less stress on your recovering back.  Find a comfortable position to sleep. Use a firm mattress and lie on your side with your knees slightly bent. If you lie on your back, put a pillow under your knees.  Only take over-the-counter or prescription medicines as directed by your caregiver. Over-the-counter medicines to reduce pain and inflammation are often the most helpful.Your caregiver may prescribe muscle relaxant drugs.These medicines help dull your pain so you can more quickly return to your normal activities and healthy exercise.  Put ice on the injured area.  Put ice in a plastic bag.  Place a towel between your skin and the bag.  Leave the ice on for 15-20 minutes, 03-04 times a day for the first 2 to 3 days. After that, ice and heat may be alternated to reduce pain and spasms.  Ask your caregiver about trying back exercises and gentle massage. This may be of some benefit.  Avoid feeling anxious or stressed.Stress increases muscle tension and can worsen back  pain.It is important to recognize when you are anxious or stressed and learn ways to manage it.Exercise is a great option. SEEK MEDICAL CARE IF:  You have pain that is not relieved with rest or medicine.  You have pain that does not improve in 1 week.  You have new symptoms.  You are generally not feeling well. SEEK IMMEDIATE MEDICAL CARE IF:   You have pain that radiates from your back into your legs.  You develop new bowel or bladder control problems.  You have unusual weakness or numbness in your arms or legs.  You develop nausea or vomiting.  You develop abdominal pain.  You feel faint. Document Released: 11/27/2005 Document Revised: 05/28/2012 Document Reviewed: 04/17/2011 ExitCare Patient Information 2015 [employer]. This information is not intended to replace advice given to you by your health care provider. Make sure you discuss any questions you have with your health care provider.  

## 2014-06-23 NOTE — ED Notes (Signed)
Pt c/o right low back pain radiates down right leg x 3 days. Pt reports h/o same.

## 2014-06-23 NOTE — ED Provider Notes (Signed)
CSN: [id number]     Arrival date & time 06/23/14  1110 History   First Bill Crawford Initiated Contact with Patient 06/23/14 1205     Chief Complaint  Patient presents with  . Back Pain     (Consider location/radiation/quality/duration/timing/severity/associated sxs/prior Treatment) Patient is a 33 y.o. male presenting with back pain. The history is provided by the patient. No language interpreter was used.  Back Pain Location:  Lumbar spine Quality:  Aching Radiates to:  Does not radiate Pain severity:  Moderate Pain is:  Same all the time Onset quality:  Gradual Duration:  3 days Timing:  Constant Progression:  Worsening Relieved by:  Nothing Worsened by:  Nothing tried Ineffective treatments:  None tried Associated symptoms: no leg pain     History reviewed. No pertinent past medical history. History reviewed. No pertinent past surgical history. No family history on file. History  Substance Use Topics  . Smoking status: Current Every Day Smoker  . Smokeless tobacco: Not on file  . Alcohol Use: No    Review of Systems  Musculoskeletal: Positive for back pain.  All other systems reviewed and are negative.     Allergies  Review of patient's allergies indicates no known allergies.  Home Medications   Prior to Admission medications   Medication Sig Start Date End Date Taking? Authorizing Provider  diphenoxylate-atropine (LOMOTIL) 2.5-0.025 MG per tablet Take 2 tablets by mouth 4 (four) times daily as needed for diarrhea or loose stools. 04/29/14   Bill AreasLeslie K Christella Crawford, Bill Crawford  HYDROcodone-acetaminophen (NORCO/VICODIN) 5-325 MG per tablet Take 2 tablets by mouth every 4 (four) hours as needed. 06/23/14   Bill AreasLeslie K Rashaunda Crawford, Bill Crawford  ibuprofen (ADVIL,MOTRIN) 800 MG tablet Take 1 tablet (800 mg total) by mouth 3 (three) times daily. 02/02/14   Bill Munchobert Lockwood, Bill Crawford  ibuprofen (ADVIL,MOTRIN) 800 MG tablet Take 1 tablet (800 mg total) by mouth 3 (three) times daily. 06/23/14   Bill AreasLeslie K Cal Gindlesperger, Bill Crawford   methocarbamol (ROBAXIN) 500 MG tablet Take 1 tablet (500 mg total) by mouth 2 (two) times daily. 06/23/14   Bill AreasLeslie K Kylar Speelman, Bill Crawford  promethazine (PHENERGAN) 25 MG tablet Take 1 tablet (25 mg total) by mouth every 6 (six) hours as needed for nausea or vomiting. 04/29/14   Bill AreasLeslie K Adaliz Dobis, Bill Crawford   BP 130/68  Pulse 89  Temp(Src) 97.7 F (36.5 C) (Oral)  Resp 20  Ht 6\' 3"  (1.905 m)  Wt 195 lb (88.451 kg)  BMI 24.37 kg/m2  SpO2 100% Physical Exam  Nursing note and vitals reviewed. Constitutional: He is oriented to person, place, and time. He appears well-developed and well-nourished.  HENT:  Head: Normocephalic.  Eyes: EOM are normal. Pupils are equal, round, and reactive to light.  Neck: Normal range of motion.  Cardiovascular: Normal rate and normal heart sounds.   Pulmonary/Chest: Effort normal.  Abdominal: Soft. He exhibits no distension.  Musculoskeletal: Normal range of motion.  Neurological: He is alert and oriented to person, place, and time.  Skin: There is erythema.  Psychiatric: He has a normal mood and affect.    ED Course  Procedures (including critical care time) Labs Review Labs Reviewed - No data to display  Imaging Review No results found.   EKG Interpretation None      MDM   Final diagnoses:  Low back pain at multiple sites    Pt given rx for ibuprofen, robaxin and ibuprofen.   ([facility]cod[facility](    Bill Bechtol K Fearrington VillageSofia, [place]PA-C 06/23/14  1346 

## 2014-06-24 NOTE — ED Provider Notes (Signed)
Medical screening examination/treatment/procedure(s) were performed by non-physician practitioner and as supervising physician I was immediately available for consultation/collaboration.   EKG Interpretation None        Vanetta MuldersScott Thressa Shiffer, MD 06/24/14 [phone]

## 2014-11-24 ENCOUNTER — Encounter (HOSPITAL_BASED_OUTPATIENT_CLINIC_OR_DEPARTMENT_OTHER): Payer: Self-pay | Admitting: Emergency Medicine

## 2014-11-24 ENCOUNTER — Emergency Department (HOSPITAL_BASED_OUTPATIENT_CLINIC_OR_DEPARTMENT_OTHER)
Admission: EM | Admit: 2014-11-24 | Discharge: 2014-11-24 | Disposition: A | Discharge status: Home / Self Care | Payer: PRIVATE HEALTH INSURANCE | Attending: Emergency Medicine | Admitting: Emergency Medicine

## 2014-11-24 DIAGNOSIS — Z79899 Other long term (current) drug therapy: Secondary | ICD-10-CM | POA: Insufficient documentation

## 2014-11-24 DIAGNOSIS — Z720 Tobacco use: Secondary | ICD-10-CM | POA: Insufficient documentation

## 2014-11-24 DIAGNOSIS — G5601 Carpal tunnel syndrome, right upper limb: Secondary | ICD-10-CM | POA: Insufficient documentation

## 2014-11-24 DIAGNOSIS — G5603 Carpal tunnel syndrome, bilateral upper limbs: Secondary | ICD-10-CM

## 2014-11-24 DIAGNOSIS — Z791 Long term (current) use of non-steroidal anti-inflammatories (NSAID): Secondary | ICD-10-CM | POA: Insufficient documentation

## 2014-11-24 DIAGNOSIS — G5602 Carpal tunnel syndrome, left upper limb: Secondary | ICD-10-CM | POA: Insufficient documentation

## 2014-11-24 MED ORDER — IBUPROFEN 800 MG PO TABS: 800.0000 mg | ORAL_TABLET | Freq: Three times a day (TID) | ORAL | Status: AC

## 2014-11-24 NOTE — ED Provider Notes (Signed)
CSN: [id number]     Arrival date & time 11/24/14  1017 History   First MD Initiated Contact with Patient 11/24/14 1202     Chief Complaint  Patient presents with  . Hand Pain   HPI  Numbness and tingling in his hands. He states that he has had this in the past and was diagnosed with carpal tunnel in his left wrist. Patient states that he got much better after he was wearing a hand brace, but has not been wearing it. He states that he has been trying Tylenol at home with little relief. Patient thinks that this pain is coming from his job where he does repetitive motions. Patient has never been evaluated by an orthopedist for this problem. Patient feels that he has had minimal swelling. He denies any frank injury or color changes.  History reviewed. No pertinent past medical history. History reviewed. No pertinent past surgical history. No family history on file. History  Substance Use Topics  . Smoking status: Current Every Day Smoker  . Smokeless tobacco: Not on file  . Alcohol Use: No    Review of Systems  Constitutional: Negative for fever, chills and fatigue.  Gastrointestinal: Negative for nausea and vomiting.  Musculoskeletal: Positive for arthralgias. Negative for joint swelling.  Skin: Negative for color change.  Neurological: Positive for numbness.  All other systems reviewed and are negative.     Allergies  Review of patient's allergies indicates no known allergies.  Home Medications   Prior to Admission medications   Medication Sig Start Date End Date Taking? Authorizing Provider  diphenoxylate-atropine (LOMOTIL) 2.5-0.025 MG per tablet Take 2 tablets by mouth 4 (four) times daily as needed for diarrhea or loose stools. 04/29/14   Elson AreasLeslie K Sofia, PA-C  HYDROcodone-acetaminophen (NORCO/VICODIN) 5-325 MG per tablet Take 2 tablets by mouth every 4 (four) hours as needed. 06/23/14   Elson AreasLeslie K Sofia, PA-C  ibuprofen (ADVIL,MOTRIN) 800 MG tablet Take 1 tablet (800 mg total)  by mouth 3 (three) times daily. 02/02/14   Gerhard Munchobert Lockwood, MD  ibuprofen (ADVIL,MOTRIN) 800 MG tablet Take 1 tablet (800 mg total) by mouth 3 (three) times daily. 11/24/14   Giavonni Cizek A Forcucci, PA-C  methocarbamol (ROBAXIN) 500 MG tablet Take 1 tablet (500 mg total) by mouth 2 (two) times daily. 06/23/14   Elson AreasLeslie K Sofia, PA-C  promethazine (PHENERGAN) 25 MG tablet Take 1 tablet (25 mg total) by mouth every 6 (six) hours as needed for nausea or vomiting. 04/29/14   Elson AreasLeslie K Sofia, PA-C   BP 134/72 mmHg  Pulse 67  Temp(Src) 98.2 F (36.8 C)  Resp 16  Ht 6\' 3"  (1.905 m)  Wt 195 lb (88.451 kg)  BMI 24.37 kg/m2  SpO2 100% Physical Exam  Constitutional: He is oriented to person, place, and time. He appears well-developed and well-nourished. No distress.  HENT:  Head: Normocephalic and atraumatic.  Mouth/Throat: Oropharynx is clear and moist. No oropharyngeal exudate.  Eyes: Conjunctivae and EOM are normal. Pupils are equal, round, and reactive to light. No scleral icterus.  Neck: Normal range of motion. Neck supple. No JVD present. No thyromegaly present.  Cardiovascular: Normal rate, regular rhythm, normal heart sounds and intact distal pulses.  Exam reveals no gallop and no friction rub.   No murmur heard. Pulses:      Radial pulses are 2+ on the right side, and 2+ on the left side.  Pulmonary/Chest: Effort normal and breath sounds normal. No respiratory distress. He has no wheezes. He has  no rales. He exhibits no tenderness.  Musculoskeletal:       Right hand: He exhibits normal range of motion, no tenderness, no bony tenderness, normal two-point discrimination, normal capillary refill, no deformity, no laceration and no swelling. Normal sensation noted. Normal strength noted.       Left hand: He exhibits normal range of motion, no tenderness, no bony tenderness, normal two-point discrimination, normal capillary refill, no deformity, no laceration and no swelling. Normal sensation noted.  Normal strength noted.  thenar eminence wasting bilaterally. Negative Tinel sign bilaterally. Positive Phalen sign bilaterally.  Lymphadenopathy:    He has no cervical adenopathy.  Neurological: He is alert and oriented to person, place, and time.  Skin: He is not diaphoretic.  Nursing note and vitals reviewed.   ED Course  Procedures (including critical care time) Labs Review Labs Reviewed - No data to display  Imaging Review No results found.   EKG Interpretation None      MDM   Final diagnoses:  Bilateral carpal tunnel syndrome   Patient is a 33 year old male who presents emergency room for evaluation of bilateral hand pain. Physical exam reveals neurovascularly intact risks bilaterally. There is positive Phalen sign bilaterally. There is no evidence of thenar eminence wasting at this time. Do not feel that imaging is warranted at this time. Suspect that this is likely carpal tunnel syndrome bilaterally. Patient does have a history of carpal tunnel syndrome. We'll give bilateral Velcro wrist splints for comfort. We'll also prescribe 800 mg ibuprofen 3 times a day 1 week. Will refer to orthopedics for further evaluation. Patient to return for septic joint symptoms. Patient is stable for discharge at this time. We will also provide the resource list for patient to set up with PCP.    Eben Burowourtney A Forcucci, PA-C 11/24/14 1222  Vanetta MuldersScott Zackowski, MD 11/26/14 [phone]

## 2014-11-24 NOTE — Discharge Instructions (Signed)
Carpal Tunnel Syndrome The carpal tunnel is a narrow area located on the palm side of your wrist. The tunnel is formed by the wrist bones and ligaments. Nerves, blood vessels, and tendons pass through the carpal tunnel. Repeated wrist motion or certain diseases may cause swelling within the tunnel. This swelling pinches the main nerve in the wrist (median nerve) and causes the painful hand and arm condition called carpal tunnel syndrome. CAUSES   Repeated wrist motions.  Wrist injuries.  Certain diseases like arthritis, diabetes, alcoholism, hyperthyroidism, and kidney failure.  Obesity.  Pregnancy. SYMPTOMS   A "pins and needles" feeling in your fingers or hand, especially in your thumb, index and middle fingers.  Tingling or numbness in your fingers or hand.  An aching feeling in your entire arm, especially when your wrist and elbow are bent for long periods of time.  Wrist pain that goes up your arm to your shoulder.  Pain that goes down into your palm or fingers.  A weak feeling in your hands. DIAGNOSIS  Your health care provider will take your history and perform a physical exam. An electromyography test may be needed. This test measures electrical signals sent out by your nerves into the muscles. The electrical signals are usually slowed by carpal tunnel syndrome. You may also need X-rays. TREATMENT  Carpal tunnel syndrome may clear up by itself. Your health care provider may recommend a wrist splint or medicine such as a nonsteroidal anti-inflammatory medicine. Cortisone injections may help. Sometimes, surgery may be needed to free the pinched nerve.  HOME CARE INSTRUCTIONS   Take all medicine as directed by your health care provider. Only take over-the-counter or prescription medicines for pain, discomfort, or fever as directed by your health care provider.  If you were given a splint to keep your wrist from bending, wear it as directed. It is important to wear the splint at  night. Wear the splint for as long as you have pain or numbness in your hand, arm, or wrist. This may take 1 to 2 months.  Rest your wrist from any activity that may be causing your pain. If your symptoms are work-related, you may need to talk to your employer about changing to a job that does not require using your wrist.  Put ice on your wrist after long periods of wrist activity.  Put ice in a plastic bag.  Place a towel between your skin and the bag.  Leave the ice on for 15-20 minutes, 03-04 times a day.  Keep all follow-up visits as directed by your health care provider. This includes any orthopedic referrals, physical therapy, and rehabilitation. Any delay in getting necessary care could result in a delay or failure of your condition to heal. SEEK IMMEDIATE MEDICAL CARE IF:   You have new, unexplained symptoms.  Your symptoms get worse and are not helped or controlled with medicines. MAKE SURE YOU:   Understand these instructions.  Will watch your condition.  Will get help right away if you are not doing well or get worse. Document Released: 11/24/2000 Document Revised: 04/13/2014 Document Reviewed: 10/13/2011 [facility] Patient Information 2015 [employer], [place]. This information is not intended to replace advice given to you by your health care provider. Make sure you discuss any questions you have with your health care provider.   Emergency Department Resource Guide 1) Find a Doctor and Pay Out of Pocket Although you won't have to find out who is covered by your insurance plan, it is a good  idea to ask around and get recommendations. You will then need to call the office and see if the doctor you have chosen will accept you as a new patient and what types of options they offer for patients who are self-pay. Some doctors offer discounts or will set up payment plans for their patients who do not have insurance, but you will need to ask so you aren't surprised when you get to your  appointment.  2) Contact Your Local Health Department Not all health departments have doctors that can see patients for sick visits, but many do, so it is worth a call to see if yours does. If you don't know where your local health department is, you can check in your phone book. The CDC also has a tool to help you locate your state's health department, and many state websites also have listings of all of their local health departments.  3) Find a Walk-in Clinic If your illness is not likely to be very severe or complicated, you may want to try a walk in clinic. These are popping up all over the country in pharmacies, drugstores, and shopping centers. They're usually staffed by nurse practitioners or physician assistants that have been trained to treat common illnesses and complaints. They're usually fairly quick and inexpensive. However, if you have serious medical issues or chronic medical problems, these are probably not your best option.  No Primary Care Doctor: - Call Health Connect at  [phone] - they can help you locate a primary care doctor that  accepts your insurance, provides certain services, etc. - Physician Referral Service- [phone]  Chronic Pain Problems: Organization         Address  Phone   Notes  [facility]  [phone]) [phone] Patients need to be referred by their primary care doctor.   Medication Assistance: Organization         Address  Phone   Notes  [facility] Medication [facility] [address] [place], [place] [address] [phone](336) [phone] --Must be a resident of [facility] [place] -- Must have NO insurance coverage whatsoever (no Medicaid/ Medicare, etc.) -- The pt. MUST have a primary care doctor that directs their care regularly and follows them in the community   MedAssist  [phone]) [phone]   [employer]  [phone]) [phone]    Agencies that provide inexpensive medical care: Organization         Address  Phone   Notes  [facility]  [phone]) [phone]   [facility]    [phone]) [phone]   [facility] [address] [place], [place] [address] [phone]) [phone]   [facility] [address], [place] [phone](336) [phone]   Planned Parenthood    [phone](336) [phone]   [facility]    [phone](336) [phone]   [facility] and [facility]  [address], [place] Phone:  [phone]) [phone], Fax:  [phone] Hours of Operation:  9 am - 6 pm, M-F.  Also accepts Medicaid/Medicare and self-pay.  [facility]  [address], [place] Phone: [phone], Fax: [phone]. Hours of Operation:  8:30 am - 5:30 pm, M-F.  Also accepts Medicaid and self-pay.  [facility] [place] [address], [place] Phone: [phone]) [phone]   [facility] [address], [place], [place] [phone], Ext. [phone] Mondays & Thursdays: 7-9 AM.  First 15  patients are seen on a first come, first serve basis.    Medicaid-accepting [facility] Providers:  Organization         Address  Phone   Notes  [facility] [address], [place] [phone]) [phone] Also accepts self-pay patients.  [facility] [address], [place]  [phone]) [phone]   [facility] [address], [address], [place] [phone]) [phone]   [facility] [address] [place] [address], [place] [phone]) [phone]   Renaye RakersVeita Bland [address], [place]   [phone]) [phone] Only accepts [place] Access [place]icaid patients after they have their name applied to their card.   Self-Pay (no insurance) in [facility]:  Organization         Address  Phone   Notes  Sickle Cell Patients, [facility] [address], [place] [phone](336) [phone]   [facility] [address], [place] [phone]) [phone]   [facility]  [place]  [address], [place] [phone]) [phone]   [facility]/Dr. Osei-Bonsu  [address] [place] [address], [place] or [phone], [place] [phone] Phone number for both [place] and [place] locations is the same.  [facility] [address], [place] [phone](336) [phone]   [facility] [place] [address] [place] [address], [place] or [address] [phone](336) [phone](336) [phone]   [facility] [address], [place] [phone]) [phone], phone; [phone]) [phone], fax Sees patients 1st and 3rd Saturday of every month.  Must not qualify for public or private insurance (i.e. Medicaid, Medicare, [place] Health Choice, Veterans' Benefits)  Household income should be no more than 200% of the poverty level The clinic cannot treat you if you are pregnant or think you are pregnant  Sexually transmitted diseases are not treated at the clinic.    Dental Care: Organization         Address  Phone  Notes  [facility] of [facility] [address], [place] [phone]) [phone] Accepts children up to age 31 who are enrolled in [place]icaid or [place] Health Choice; pregnant women with a Medicaid card; and children who have applied for Medicaid or [place] Health Choice, but were declined, whose parents can pay a reduced fee at time of service.  [facility] of [facility] [place]  [address], [place] [phone]) [phone] Accepts children up to age 721 who are enrolled in [place]icaid or [place] Health Choice; pregnant women with a Medicaid card; and children who have applied for Medicaid or [place] Health Choice, but were declined, whose parents can pay a reduced fee at time of service.  [facility]  [address], [place] [phone]) [phone] Patients are seen by appointment only. Walk-ins are not accepted. [facility] will see patients 31 years of age and  older. Monday - Tuesday (8am-5pm) Most Wednesdays (8:30-5pm) $30 per visit, cash only  [facility]  [address], [facility] [place] [phone] Patients are seen by appointment only. Walk-ins are not accepted. [facility] will see patients 31 years of age and older. One Wednesday Evening (Monthly: Volunteer Based).  $30 per visit, cash only  [employer] [facility]  [phone] for adults; Children under age 644, call Graduate Pediatric Dentistry  at [phone]) [phone]. Children aged 504-14, please call [phone]) [phone] to request a pediatric application.  Dental services are provided in all areas of dental care including fillings, crowns and bridges, complete and partial dentures, implants, gum treatment, root canals, and extractions. Preventive care is also provided. Treatment is provided to both adults and children. Patients are selected via a lottery and there is often a waiting list.   [facility] [address], [place]  [phone]) [phone] [email or web address]   [facility] [address], [place], [place] [phone], Ext. [phone] Second and Fourth Thursday of each month, opens at 6:30 AM; Clinic ends at 9 AM.  Patients are seen on a first-come first-served basis, and a limited number are seen during each clinic.   [facility]  [address], [place], [place] [phone]) [phone]   Eligibility Requirements You must have lived in [place], [place], or [place] counties for at least the last three months.   You cannot be eligible for state or federal sponsored National Cityhealthcare insurance, including CIGNAVeterans Administration, [place]Medicaid, or Harrah[employer] EntertainmentMedicare.   You generally cannot be eligible for healthcare insurance through your employer.    How to apply: Eligibility screenings are held every Tuesday and Wednesday afternoon from 1:00 pm until 4:00 pm. You do not need an appointment for the interview!  [facility] [address],  [place], [place] [phone]   [facility]  [phone]   [facility]  [phone]   [facility]  [phone]    Behavioral Health Resources in the Community: Intensive Outpatient Programs Organization         Address  Phone  Notes  [facility] [address], [place], [place] [phone]   [facility] [address], [place], [place] [phone]   [facility] [address], [place], [place]  [phone]   [facility] [address],  [place], [place] [phone] or [phone]   Substance Abuse Resources Organization         Address  Phone  Notes  [facility] Drug Services  [phone]   [facility]  [phone]   [facility]  [phone]   [facility]  [phone]   Residential & Outpatient Substance Abuse Program  [phone]   Psychological Services Organization         Address  Phone  Notes  [facility]  [phone]- [phone]   [facility]  [phone]- [phone]   [facility] [address], [place] [phone] or [phone]    Mobile Crisis Teams Organization         Address  Phone  Notes  [facility], Mobile Crisis Care Unit  [phone]   [facility]  [address] [place], [place] [phone]   Doristine LocksSharon DeEsch [address] [place] [phone]    Self-Help/Support Groups Organization         Address  Phone             Notes  [facility] [place] - variety of support groups  [phone]- [phone] Call for more information  Narcotics Anonymous (NA), [facility] [address], [place]  2 meetings at this location   Chief Executive Officeresidential Treatment Programs Organization         Address  Phone  Notes  [facility] [address],    [place]  [phone]   [facility]  [address], [place] [phone], [place], [place] [phone]   [facility] [address], [place] [phone] Admissions: 8am-3pm M-F  [facility] [address]    [place], [place] [phone]   [facility] [address], [place], [place] [phone]   [facility] [address]  [place], [place] [phone]   [facility] - Intensive Outpatient [address], [place], [place] [phone]   [facility] ([facility] [address]  [place], [place] [phone] or [phone]   [facility] (RTS) [address] [place], [place] [phone] Accepts Medicaid  [facility] [address]  [place] [phone] Substance Abuse/Addiction Treatment   [facility] Behavioral Health Resources Organization         Address  Phone  Notes  [facility]  [phone]   Angie FavaJulie Brannon, PhD [address], [address] [place], [place]   [phone](336) [phone] or [phone]) [phone]   [facility]   [address] [place], [place] [phone]) [phone]   [facility] [address], [place], [place] [phone](336) [phone] Insurance/Medicaid/sponsorship through [facility]  Faith and Families [address]                                    [place], [place] [phone](336) [phone] Therapy/tele-psych/case  [facility] [address].   [place], [place] [phone](336) [phone]    Dr. Lolly MustacheArfeen  [phone](336) [phone]   [facility]  [employer] [facility]. 1) [address], [place] 2) [address], [place] 3)  [address], [place] [phone](336) [phone](336) [phone]  [phone](336) [phone]   [facility] Child Abuse Hotline [phone](336) [phone] or [phone](336) [phone] (After Hours)

## 2014-11-24 NOTE — ED Notes (Signed)
Pt states was seen for carpel tunnel last year in left hand. Today complaining of bilateral pain to wrist/hand. Does do repetitive work with hands. Complaining of discomfort and tingling with numbness. Denies other symptoms.

## 2015-01-12 ENCOUNTER — Emergency Department (HOSPITAL_BASED_OUTPATIENT_CLINIC_OR_DEPARTMENT_OTHER)
Admission: EM | Admit: 2015-01-12 | Discharge: 2015-01-12 | Disposition: A | Discharge status: Home / Self Care | Payer: PRIVATE HEALTH INSURANCE | Attending: Emergency Medicine | Admitting: Emergency Medicine

## 2015-01-12 ENCOUNTER — Encounter (HOSPITAL_BASED_OUTPATIENT_CLINIC_OR_DEPARTMENT_OTHER): Payer: Self-pay | Admitting: *Deleted

## 2015-01-12 DIAGNOSIS — Z720 Tobacco use: Secondary | ICD-10-CM | POA: Insufficient documentation

## 2015-01-12 DIAGNOSIS — Z791 Long term (current) use of non-steroidal anti-inflammatories (NSAID): Secondary | ICD-10-CM | POA: Insufficient documentation

## 2015-01-12 DIAGNOSIS — J029 Acute pharyngitis, unspecified: Secondary | ICD-10-CM | POA: Insufficient documentation

## 2015-01-12 DIAGNOSIS — Z79899 Other long term (current) drug therapy: Secondary | ICD-10-CM | POA: Insufficient documentation

## 2015-01-12 LAB — RAPID STREP SCREEN (MED CTR MEBANE ONLY): Streptococcus, Group A Screen (Direct): NEGATIVE

## 2015-01-12 NOTE — ED Provider Notes (Signed)
CSN: [id number]     Arrival date & time 01/12/15  1111 History   First MD Initiated Contact with Patient 01/12/15 1207     Chief Complaint  Patient presents with  . Sore Throat     (Consider location/radiation/quality/duration/timing/severity/associated sxs/prior Treatment) HPI Comments: Patient presents today with a chief complaint of sore throat for the past 3 days.  Pain is gradually worsening.  Pain worse with swallowing.  He reports an associated fever, but has not checked his temperature with a thermometer.  Temp is 98.3 F upon arrival in the ED.  Last dose of antipyretic was 4:00 AM this morning.  He reports that he has been taking Ibuprofen for the pain with some improvement.  He denies cough, congestion, nausea, vomiting, ear pain, or sinus pain.  No known sick contacts.  The history is provided by the patient.    History reviewed. No pertinent past medical history. History reviewed. No pertinent past surgical history. No family history on file. History  Substance Use Topics  . Smoking status: Current Every Day Smoker -- 0.50 packs/day    Types: Cigarettes  . Smokeless tobacco: Not on file  . Alcohol Use: No    Review of Systems  All other systems reviewed and are negative.     Allergies  Review of patient's allergies indicates no known allergies.  Home Medications   Prior to Admission medications   Medication Sig Start Date End Date Taking? Authorizing Provider  diphenoxylate-atropine (LOMOTIL) 2.5-0.025 MG per tablet Take 2 tablets by mouth 4 (four) times daily as needed for diarrhea or loose stools. 04/29/14   Elson Areas, PA-C  HYDROcodone-acetaminophen (NORCO/VICODIN) 5-325 MG per tablet Take 2 tablets by mouth every 4 (four) hours as needed. 06/23/14   Elson Areas, PA-C  ibuprofen (ADVIL,MOTRIN) 800 MG tablet Take 1 tablet (800 mg total) by mouth 3 (three) times daily. 02/02/14   Gerhard Munch, MD  ibuprofen (ADVIL,MOTRIN) 800 MG tablet Take 1 tablet (800  mg total) by mouth 3 (three) times daily. 11/24/14   Courtney A Forcucci, PA-C  methocarbamol (ROBAXIN) 500 MG tablet Take 1 tablet (500 mg total) by mouth 2 (two) times daily. 06/23/14   Elson Areas, PA-C  promethazine (PHENERGAN) 25 MG tablet Take 1 tablet (25 mg total) by mouth every 6 (six) hours as needed for nausea or vomiting. 04/29/14   Elson Areas, PA-C   BP 124/50 mmHg  Pulse 76  Temp(Src) 98.3 F (36.8 C) (Oral)  Resp 18  Ht  (1.905 m)  Wt 195 lb (88.451 kg)  BMI 24.37 kg/m2  SpO2 100% Physical Exam  Constitutional: He appears well-developed and well-nourished.  HENT:  Head: Normocephalic and atraumatic.  Right Ear: Tympanic membrane and ear canal normal.  Left Ear: Tympanic membrane and ear canal normal.  Mouth/Throat: Uvula is midline. No trismus in the jaw. No oropharyngeal exudate or tonsillar abscesses.  Bilateral tonsillar enlargement Mild erythema of the oropharynx Normal voice phonation Patient handling secretions well, no drooling   Neck: Normal range of motion. Neck supple.  Tenderness to palpation of the anterior cervical lymph nodes  Cardiovascular: Normal rate, regular rhythm and normal heart sounds.   Pulmonary/Chest: Effort normal and breath sounds normal.  Musculoskeletal: Normal range of motion.  Neurological: He is alert.  Skin: Skin is warm and dry.  Psychiatric: He has a normal mood and affect.  Nursing note and vitals reviewed.   ED Course  Procedures (including critical care time) Labs Review  Labs Reviewed  RAPID STREP SCREEN  CULTURE, GROUP A STREP    Imaging Review No results found.   EKG Interpretation None      MDM   Final diagnoses:  None   Patient presents today with sore throat.  Rapid strep negative.  Culture pending.  No signs of Peritonsillar or Retropharyngeal Abscess at this time.  Patient stable for discharge.  Return precautions given.    Santiago GladHeather Henli Hey, PA-C 01/12/15 1406  Geoffery Lyonsouglas Delo,  MD 01/12/15 1459

## 2015-01-12 NOTE — ED Notes (Signed)
Sore throat, drainage from ears, dizziness, and headache x 3 days.

## 2015-01-14 LAB — CULTURE, GROUP A STREP

## 2015-03-01 ENCOUNTER — Encounter (HOSPITAL_BASED_OUTPATIENT_CLINIC_OR_DEPARTMENT_OTHER): Payer: Self-pay

## 2015-03-01 ENCOUNTER — Emergency Department (HOSPITAL_BASED_OUTPATIENT_CLINIC_OR_DEPARTMENT_OTHER)
Admission: EM | Admit: 2015-03-01 | Discharge: 2015-03-01 | Disposition: A | Discharge status: Home / Self Care | Payer: PRIVATE HEALTH INSURANCE | Attending: Emergency Medicine | Admitting: Emergency Medicine

## 2015-03-01 ENCOUNTER — Emergency Department (HOSPITAL_BASED_OUTPATIENT_CLINIC_OR_DEPARTMENT_OTHER): Payer: PRIVATE HEALTH INSURANCE

## 2015-03-01 DIAGNOSIS — S161XXA Strain of muscle, fascia and tendon at neck level, initial encounter: Secondary | ICD-10-CM | POA: Insufficient documentation

## 2015-03-01 DIAGNOSIS — Y9389 Activity, other specified: Secondary | ICD-10-CM | POA: Insufficient documentation

## 2015-03-01 DIAGNOSIS — Y998 Other external cause status: Secondary | ICD-10-CM | POA: Insufficient documentation

## 2015-03-01 DIAGNOSIS — Z79899 Other long term (current) drug therapy: Secondary | ICD-10-CM | POA: Insufficient documentation

## 2015-03-01 DIAGNOSIS — W208XXA Other cause of strike by thrown, projected or falling object, initial encounter: Secondary | ICD-10-CM | POA: Insufficient documentation

## 2015-03-01 DIAGNOSIS — Y9289 Other specified places as the place of occurrence of the external cause: Secondary | ICD-10-CM | POA: Insufficient documentation

## 2015-03-01 DIAGNOSIS — Z720 Tobacco use: Secondary | ICD-10-CM | POA: Insufficient documentation

## 2015-03-01 MED ORDER — CYCLOBENZAPRINE HCL 10 MG PO TABS: 10.0000 mg | ORAL_TABLET | Freq: Two times a day (BID) | ORAL | Status: AC | PRN

## 2015-03-01 MED ORDER — IBUPROFEN 800 MG PO TABS
800.0000 mg | ORAL_TABLET | Freq: Three times a day (TID) | ORAL | Status: AC
Start: 2015-03-01 — End: ?

## 2015-03-01 NOTE — ED Notes (Signed)
Pt not on stretcher at this time.

## 2015-03-01 NOTE — ED Notes (Signed)
Pt states he was unloading truck on 3/19-stack of bunk beds fell on the back of his neck

## 2015-03-01 NOTE — Discharge Instructions (Signed)
Cervical Strain and Sprain (Whiplash) °with Rehab °Cervical strain and sprain are injuries that commonly occur with "whiplash" injuries. Whiplash occurs when the neck is forcefully whipped backward or forward, such as during a motor vehicle accident or during contact sports. The muscles, ligaments, tendons, discs, and nerves of the neck are susceptible to injury when this occurs. °RISK FACTORS °Risk of having a whiplash injury increases if: °· Osteoarthritis of the spine. °· Situations that make head or neck accidents or trauma more likely. °· High-risk sports (football, rugby, wrestling, hockey, auto racing, gymnastics, diving, contact karate, or boxing). °· Poor strength and flexibility of the neck. °· Previous neck injury. °· Poor tackling technique. °· Improperly fitted or padded equipment. °SYMPTOMS  °· Pain or stiffness in the front or back of neck or both. °· Symptoms may present immediately or up to 24 hours after injury. °· Dizziness, headache, nausea, and vomiting. °· Muscle spasm with soreness and stiffness in the neck. °· Tenderness and swelling at the injury site. °PREVENTION °· Learn and use proper technique (avoid tackling with the head, spearing, and head-butting; use proper falling techniques to avoid landing on the head). °· Warm up and stretch properly before activity. °· Maintain physical fitness: °¨ Strength, flexibility, and endurance. °¨ Cardiovascular fitness. °· Wear properly fitted and padded protective equipment, such as padded soft collars, for participation in contact sports. °PROGNOSIS  °Recovery from cervical strain and sprain injuries is dependent on the extent of the injury. These injuries are usually curable in 1 week to 3 months with appropriate treatment.  °RELATED COMPLICATIONS  °· Temporary numbness and weakness may occur if the nerve roots are damaged, and this may persist until the nerve has completely healed. °· Chronic pain due to frequent recurrence of  symptoms. °· Prolonged healing, especially if activity is resumed too soon (before complete recovery). °TREATMENT  °Treatment initially involves the use of ice and medication to help reduce pain and inflammation. It is also important to perform strengthening and stretching exercises and modify activities that worsen symptoms so the injury does not get worse. These exercises may be performed at home or with a therapist. For patients who experience severe symptoms, a soft, padded collar may be recommended to be worn around the neck.  °Improving your posture may help reduce symptoms. Posture improvement includes pulling your chin and abdomen in while sitting or standing. If you are sitting, sit in a firm chair with your buttocks against the back of the chair. While sleeping, try replacing your pillow with a small towel rolled to 2 inches in diameter, or use a cervical pillow or soft cervical collar. Poor sleeping positions delay healing.  °For patients with nerve root damage, which causes numbness or weakness, the use of a cervical traction apparatus may be recommended. Surgery is rarely necessary for these injuries. However, cervical strain and sprains that are present at birth (congenital) may require surgery. °MEDICATION  °· If pain medication is necessary, nonsteroidal anti-inflammatory medications, such as aspirin and ibuprofen, or other minor pain relievers, such as acetaminophen, are often recommended. °· Do not take pain medication for 7 days before surgery. °· Prescription pain relievers may be given if deemed necessary by your caregiver. Use only as directed and only as much as you need. °HEAT AND COLD:  °· Cold treatment (icing) relieves pain and reduces inflammation. Cold treatment should be applied for 10 to 15 minutes every 2 to 3 hours for inflammation and pain and immediately after any activity that aggravates   your symptoms. Use ice packs or an ice massage. °· Heat treatment may be used prior to  performing the stretching and strengthening activities prescribed by your caregiver, physical therapist, or athletic trainer. Use a heat pack or a warm soak. °SEEK MEDICAL CARE IF:  °· Symptoms get worse or do not improve in 2 weeks despite treatment. °· New, unexplained symptoms develop (drugs used in treatment may produce side effects). °EXERCISES °RANGE OF MOTION (ROM) AND STRETCHING EXERCISES - Cervical Strain and Sprain °These exercises may help you when beginning to rehabilitate your injury. In order to successfully resolve your symptoms, you must improve your posture. These exercises are designed to help reduce the forward-head and rounded-shoulder posture which contributes to this condition. Your symptoms may resolve with or without further involvement from your physician, physical therapist or athletic trainer. While completing these exercises, remember:  °· Restoring tissue flexibility helps normal motion to return to the joints. This allows healthier, less painful movement and activity. °· An effective stretch should be held for at least 20 seconds, although you may need to begin with shorter hold times for comfort. °· A stretch should never be painful. You should only feel a gentle lengthening or release in the stretched tissue. °STRETCH- Axial Extensors °· Lie on your back on the floor. You may bend your knees for comfort. Place a rolled-up hand towel or dish towel, about 2 inches in diameter, under the part of your head that makes contact with the floor. °· Gently tuck your chin, as if trying to make a "double chin," until you feel a gentle stretch at the base of your head. °· Hold __________ seconds. °Repeat __________ times. Complete this exercise __________ times per day.  °STRETCH - Axial Extension  °· Stand or sit on a firm surface. Assume a good posture: chest up, shoulders drawn back, abdominal muscles slightly tense, knees unlocked (if standing) and feet hip width apart. °· Slowly retract your  chin so your head slides back and your chin slightly lowers. Continue to look straight ahead. °· You should feel a gentle stretch in the back of your head. Be certain not to feel an aggressive stretch since this can cause headaches later. °· Hold for __________ seconds. °Repeat __________ times. Complete this exercise __________ times per day. °STRETCH - Cervical Side Bend  °· Stand or sit on a firm surface. Assume a good posture: chest up, shoulders drawn back, abdominal muscles slightly tense, knees unlocked (if standing) and feet hip width apart. °· Without letting your nose or shoulders move, slowly tip your right / left ear to your shoulder until your feel a gentle stretch in the muscles on the opposite side of your neck. °· Hold __________ seconds. °Repeat __________ times. Complete this exercise __________ times per day. °STRETCH - Cervical Rotators  °· Stand or sit on a firm surface. Assume a good posture: chest up, shoulders drawn back, abdominal muscles slightly tense, knees unlocked (if standing) and feet hip width apart. °· Keeping your eyes level with the ground, slowly turn your head until you feel a gentle stretch along the back and opposite side of your neck. °· Hold __________ seconds. °Repeat __________ times. Complete this exercise __________ times per day. °RANGE OF MOTION - Neck Circles  °· Stand or sit on a firm surface. Assume a good posture: chest up, shoulders drawn back, abdominal muscles slightly tense, knees unlocked (if standing) and feet hip width apart. °· Gently roll your head down and around from the   back of one shoulder to the back of the other. The motion should never be forced or painful. °· Repeat the motion 10-20 times, or until you feel the neck muscles relax and loosen. °Repeat __________ times. Complete the exercise __________ times per day. °STRENGTHENING EXERCISES - Cervical Strain and Sprain °These exercises may help you when beginning to rehabilitate your injury. They may  resolve your symptoms with or without further involvement from your physician, physical therapist, or athletic trainer. While completing these exercises, remember:  °· Muscles can gain both the endurance and the strength needed for everyday activities through controlled exercises. °· Complete these exercises as instructed by your physician, physical therapist, or athletic trainer. Progress the resistance and repetitions only as guided. °· You may experience muscle soreness or fatigue, but the pain or discomfort you are trying to eliminate should never worsen during these exercises. If this pain does worsen, stop and make certain you are following the directions exactly. If the pain is still present after adjustments, discontinue the exercise until you can discuss the trouble with your clinician. °STRENGTH - Cervical Flexors, Isometric °· Face a wall, standing about 6 inches away. Place a small pillow, a ball about 6-8 inches in diameter, or a folded towel between your forehead and the wall. °· Slightly tuck your chin and gently push your forehead into the soft object. Push only with mild to moderate intensity, building up tension gradually. Keep your jaw and forehead relaxed. °· Hold 10 to 20 seconds. Keep your breathing relaxed. °· Release the tension slowly. Relax your neck muscles completely before you start the next repetition. °Repeat __________ times. Complete this exercise __________ times per day. °STRENGTH- Cervical Lateral Flexors, Isometric  °· Stand about 6 inches away from a wall. Place a small pillow, a ball about 6-8 inches in diameter, or a folded towel between the side of your head and the wall. °· Slightly tuck your chin and gently tilt your head into the soft object. Push only with mild to moderate intensity, building up tension gradually. Keep your jaw and forehead relaxed. °· Hold 10 to 20 seconds. Keep your breathing relaxed. °· Release the tension slowly. Relax your neck muscles completely  before you start the next repetition. °Repeat __________ times. Complete this exercise __________ times per day. °STRENGTH - Cervical Extensors, Isometric  °· Stand about 6 inches away from a wall. Place a small pillow, a ball about 6-8 inches in diameter, or a folded towel between the back of your head and the wall. °· Slightly tuck your chin and gently tilt your head back into the soft object. Push only with mild to moderate intensity, building up tension gradually. Keep your jaw and forehead relaxed. °· Hold 10 to 20 seconds. Keep your breathing relaxed. °· Release the tension slowly. Relax your neck muscles completely before you start the next repetition. °Repeat __________ times. Complete this exercise __________ times per day. °POSTURE AND BODY MECHANICS CONSIDERATIONS - Cervical Strain and Sprain °Keeping correct posture when sitting, standing or completing your activities will reduce the stress put on different body tissues, allowing injured tissues a chance to heal and limiting painful experiences. The following are general guidelines for improved posture. Your physician or physical therapist will provide you with any instructions specific to your needs. While reading these guidelines, remember: °· The exercises prescribed by your provider will help you have the flexibility and strength to maintain correct postures. °· The correct posture provides the optimal environment for your joints to   work. All of your joints have less wear and tear when properly supported by a spine with good posture. This means you will experience a healthier, less painful body. °· Correct posture must be practiced with all of your activities, especially prolonged sitting and standing. Correct posture is as important when doing repetitive low-stress activities (typing) as it is when doing a single heavy-load activity (lifting). °PROLONGED STANDING WHILE SLIGHTLY LEANING FORWARD °When completing a task that requires you to lean  forward while standing in one place for a long time, place either foot up on a stationary 2- to 4-inch high object to help maintain the best posture. When both feet are on the ground, the low back tends to lose its slight inward curve. If this curve flattens (or becomes too large), then the back and your other joints will experience too much stress, fatigue more quickly, and can cause pain.  °RESTING POSITIONS °Consider which positions are most painful for you when choosing a resting position. If you have pain with flexion-based activities (sitting, bending, stooping, squatting), choose a position that allows you to rest in a less flexed posture. You would want to avoid curling into a fetal position on your side. If your pain worsens with extension-based activities (prolonged standing, working overhead), avoid resting in an extended position such as sleeping on your stomach. Most people will find more comfort when they rest with their spine in a more neutral position, neither too rounded nor too arched. Lying on a non-sagging bed on your side with a pillow between your knees, or on your back with a pillow under your knees will often provide some relief. Keep in mind, being in any one position for a prolonged period of time, no matter how correct your posture, can still lead to stiffness. °WALKING °Walk with an upright posture. Your ears, shoulders, and hips should all line up. °OFFICE WORK °When working at a desk, create an environment that supports good, upright posture. Without extra support, muscles fatigue and lead to excessive strain on joints and other tissues. °CHAIR: °· A chair should be able to slide under your desk when your back makes contact with the back of the chair. This allows you to work closely. °· The chair's height should allow your eyes to be level with the upper part of your monitor and your hands to be slightly lower than your elbows. °· Body position: °¨ Your feet should make contact with the  floor. If this is not possible, use a foot rest. °¨ Keep your ears over your shoulders. This will reduce stress on your neck and low back. °Document Released: 11/27/2005 Document Revised: 04/13/2014 Document Reviewed: 03/11/2009 °ExitCare® Patient Information ©2015 [employer]. This information is not intended to replace advice given to you by your health care provider. Make sure you discuss any questions you have with your health care provider. ° ° °Emergency Department Resource Guide °1) Find a Doctor and Pay Out of Pocket °Although you won't have to find out who is covered by your insurance plan, it is a good idea to ask around and get recommendations. You will then need to call the office and see if the doctor you have chosen will accept you as a new patient and what types of options they offer for patients who are self-pay. Some doctors offer discounts or will set up payment plans for their patients who do not have insurance, but you will need to ask so you aren't surprised when you get to your   appointment. ° °2) Contact Your Local Health Department °Not all health departments have doctors that can see patients for sick visits, but many do, so it is worth a call to see if yours does. If you don't know where your local health department is, you can check in your phone book. The CDC also has a tool to help you locate your state's health department, and many state websites also have listings of all of their local health departments. ° °3) Find a Walk-in Clinic °If your illness is not likely to be very severe or complicated, you may want to try a walk in clinic. These are popping up all over the country in pharmacies, drugstores, and shopping centers. They're usually staffed by nurse practitioners or physician assistants that have been trained to treat common illnesses and complaints. They're usually fairly quick and inexpensive. However, if you have serious medical issues or chronic medical problems, these are  probably not your best option. ° °No Primary Care Doctor: °- Call [facility] at  832-8000 - they can help you locate a primary care doctor that  accepts your insurance, provides certain services, etc. °- Physician Referral Service- 1-800-533-3463 ° °Chronic Pain Problems: °Organization         Address  Phone   Notes  °[facility]  ([phone]) [phone] Patients need to be referred by their primary care doctor.  ° °Medication Assistance: °Organization         Address  Phone   Notes  °[place] [facility] [address] °[place], [place] [address] (336) 641-8030 --Must be a resident of [place] °-- Must have NO insurance coverage whatsoever (no Medicaid/ Medicare, etc.) °-- The pt. MUST have a primary care doctor that directs their care regularly and follows them in the community °  °[facility]  (866) 331-1348   °[employer]  (888) 892-1162   ° °Agencies that provide inexpensive medical care: °Organization         Address  Phone   Notes  °[facility]  ([phone]) [phone]   °[facility]    (336) 832-7272   °[facility] [address] °[place], [place] [address] (336) 832-4777   °[facility] [address], °[place] (336) 271-4999   °[facility]    (336) 373-0678   °[facility]    (336) 272-1050   °[facility] ° [address], [place] Phone:  (336) 832-4444, Fax:  (336) [phone] Hours of Operation:  9 am - 6 pm, M-F.  Also accepts Medicaid/Medicare and self-pay.  °[facility] ° [address], [place] Phone: ([phone]) [phone], Fax: ([phone]) [phone]. Hours of Operation:  8:30 am - 5:30 pm, M-F.  Also accepts Medicaid and self-pay.  °[facility] [place] [address], [place] Phone: ([phone]) [phone]   °[facility] [address], [place], [place] (336)723-1848, Ext. 123 Mondays & Thursdays:  7-9 AM.  First 15 patients are seen on a first come, first serve basis. °  ° °Medicaid-accepting [place] Providers: ° °Organization         Address  Phone   Notes  °[facility] [address], [place] ([phone]) [phone] Also accepts self-pay patients.  °[facility] [address], [place] ° (336) [phone]   °[facility] [address], [place] ([phone]) [phone]   °[facility] [address] [place] [address], [place] (336) [phone]   °[facility] [address], [place]  ° ([phone]) [phone] Only accepts [place] Access Medicaid patients after they have their name applied to their card.  ° °Self-Pay (no insurance) in [place]: ° °Organization         Address  Phone   Notes  °Sickle Cell Patients, [facility] [address], [place] (336) [phone]   °[facility] [address], [place] (336) [phone]   °[facility] ° [address], [place] (336) [phone]   °[facility]/Dr. Osei-Bonsu ° [address] [place] [address], [place] or [address], [place] ([phone]) [phone] Phone number for both [place] and [place] locations is the same.  °[facility] [address], [place] ([phone]) [phone]   °[facility] [address] [place] [address], [place] or [address] (336) [phone] °(336) 878-2260   °[facility] [address], [place] (336) [phone], phone; (336) [phone], fax Sees patients 1st and 3rd Saturday of every month.  Must not qualify for public or private insurance (i.e. Medicaid, Medicare, [place] Health Choice, Veterans' Benefits) • Household income should be no more than 200% of the poverty level •The clinic cannot treat you if you are pregnant or think you are pregnant • Sexually transmitted diseases are not treated at the clinic.   ° ° °Dental Care: °Organization         Address  Phone  Notes  °[facility] [address], [place] (336) [phone] Accepts children up to age 21 who are enrolled in Medicaid or [place] Health Choice; pregnant women with a Medicaid card; and children who have applied for Medicaid or [place] Health Choice, but were declined, whose parents can pay a reduced fee at time of service.  °[facility] [place]  [address], [place] (336) 641-7733 Accepts children up to age 21 who are enrolled in Medicaid or [place] Health Choice; pregnant women with a Medicaid card; and children who have applied for Medicaid or [place] Health Choice, but were declined, whose parents can pay a reduced fee at time of service.  °[facility] ° [address], [place] ([phone]) [phone] Patients are seen by appointment only. Walk-ins are not accepted. [facility] will see patients 18 years of age and older. °Monday - Tuesday (8am-5pm) °Most Wednesdays (8:30-5pm) °$30 per visit, cash only  °[facility] ° [address], [place] ([phone]) [phone] Patients are seen by appointment only. Walk-ins are not accepted. [facility] will see patients 18 years of age and older. °One Wednesday Evening (Monthly: Volunteer Based).  $30 per visit, cash only  °[facility]  ([phone]) [phone] for adults; Children under age 4, call Graduate Pediatric Dentistry at ([phone]) [phone]. Children aged 4-14, please call ([phone]) [phone] to request a pediatric application. ° Dental services are provided in all areas of dental care including fillings, crowns and bridges, complete and partial dentures, implants, gum treatment, root canals, and extractions. Preventive care is also provided. Treatment is provided to both adults and children. °Patients are selected via a lottery and there is often a waiting  list. °  °[facility] [address], °[place] ° (336) 763-8833 [email or web address] °  °[facility] [address], [place], [place] (336)723-1848, Ext. 123 Second and Fourth Thursday of each month, opens at 6:30 AM; Clinic ends at 9 AM.  Patients are seen on a first-come first-served basis, and a limited number are seen during each clinic.  ° °[facility] ° [address], [place], [place] ([phone]) [phone]   Eligibility Requirements °You must have lived in [place], [place], or [place] counties for at least the last three months. °  You cannot be eligible for state or federal sponsored healthcare insurance, including Veterans Administration, Medicaid, or Medicare. °  You generally cannot be eligible for healthcare insurance through your employer.  °  How to apply: °Eligibility screenings are held every Tuesday and Wednesday afternoon from 1:00 pm until 4:00 pm. You do not need an appointment for the interview!  °[facility] [address], [place], [place] [phone]   °[facility]  336-342-8273   °[facility]  336-703-3100   ° [facility]  336-570-6415   ° °Behavioral Health Resources in the Community: °Intensive Outpatient Programs °Organization         Address  Phone  Notes  °[facility] [address], [place], [place] 336-878-6098   °[facility] [address], [place], [place] [phone]   °[facility] [address], [place], [place] ° 336-882-2125   °[facility] [address],  °[place], [place] 1-800-853-5163 or 336-641-4981   °Substance Abuse Resources °Organization         Address  Phone  Notes  °[facility]  336-882-2125   °[facility]  336-784-9470   °[facility]  336-285-9073   °[facility]  336-845-3988   °[facility] Out[facility]stance Abuse Program   1-800-659-3381   °Psychological Services °Organization         Address  Phone  Notes  °[facility]  336- 832-9600   °[facility]  336- 378-7881   °[facility] [address], [place] 1-800-853-5163 or 336-641-4981   ° °Mobile Crisis Teams °Organization         Address  Phone  Notes  °[facility], Mobile Crisis Care Unit  1-877-626-1772   °Assertive °[facility] ° [address] [place], [place] 336-834-9664   °[facility] [address] [address] °[place] [phone]   ° °Self-Help/Support Groups °Organization         Address  Phone             Notes  °[facility] - variety of support groups  [phone] Call for more information  °Narcotics Anonymous (NA), [facility] [address], °[place]  2 meetings at this location  ° °Residential Treatment Programs °Organization         Address  Phone  Notes  °[facility] [address],    °[place]  [phone]   °[facility] ° [address], [place], [place] [phone]   °[facility] [address], [place] [phone] Admissions: 8am-3pm M-F  °[facility] [address]    °[place], [place] [phone]   °[facility] [address], [place], [place] [phone]   °[facility] [address]  °,  [phone]   °  [facility] - Intensive Outpatient [address], [place], [place] [phone]   °[facility] [address]  °[place], [place] 1-877-615-2722 or [phone]   °[facility] [address] [place], [place] [phone] Accepts Medicaid  °[facility] [address]  °[place] 1-800-659-3381 Substance Abuse/Addiction Treatment  ° °[place] [facility] °Organization          Address  Phone  Notes  °[facility]  ([phone]) [phone]   °Julie Brannon, PhD [address] [place], [place]   ([phone]) [phone] or (336) [phone]   °[facility]   [address] °[place], [place] ([phone]) [phone]   °[facility] [address], [place], [place] [phone]) [phone] Insurance/Medicaid/sponsorship through [facility]  °[facility] [address]                                    [place], [place] [phone]) [phone] Therapy/tele-psych/case  °[facility] [address]  ° [place], [place] ([phone]) [phone]    °Dr. Arfeen  ([phone]) [phone]   °[facility]  [employer] [facility] 1) [address], [place] °2) [address], [place] °3)  [address], [place] ([phone]) [phone] °(336) [phone] ° °([phone]) [phone]   °[place] Child Abuse [facility]line (336) 342-1394 or (336) [phone] (After Hours)    ° ° ° °

## 2015-03-01 NOTE — ED Provider Notes (Signed)
CSN: [id number]     Arrival date & time 03/01/15  1534 History   First MD Initiated Contact with Patient 03/01/15 1704     Chief Complaint  Patient presents with  . Neck Injury     (Consider location/radiation/quality/duration/timing/severity/associated sxs/prior Treatment) HPI Bill Crawford is a 34 year old male with no significant past medical history who presents the ER complaining of neck pain. Patient states he was unloading a truck which had boxes of metal bunkbed which are approximate 75 pounds each when a stack of these boxes fell on his back and lower neck. He states the boxes knocked to the ground. Patient reports lateral neck pain since this incident. Patient states the pain has worsened over the past several days. He states he has been using ibuprofen which has been providing relief, however he was concerned that the pain is still persisting after 2 days. Patient denies any loss of consciousness, headache, blurred vision, dizziness, weakness, numbness.   History reviewed. No pertinent past medical history. History reviewed. No pertinent past surgical history. No family history on file. History  Substance Use Topics  . Smoking status: Current Every Day Smoker -- 0.50 packs/day    Types: Cigarettes  . Smokeless tobacco: Not on file  . Alcohol Use: No    Review of Systems  Constitutional: Negative for fever.  Eyes: Negative for visual disturbance.  Respiratory: Negative for shortness of breath.   Cardiovascular: Negative for chest pain.  Gastrointestinal: Negative for nausea, vomiting and abdominal pain.  Genitourinary: Negative for dysuria.  Musculoskeletal: Positive for neck pain.  Skin: Negative for rash.  Neurological: Negative for dizziness, syncope, weakness and numbness.  Psychiatric/Behavioral: Negative.       Allergies  Review of patient's allergies indicates no known allergies.  Home Medications   Prior to Admission medications   Medication Sig Start  Date End Date Taking? Authorizing Provider  cyclobenzaprine (FLEXERIL) 10 MG tablet Take 1 tablet (10 mg total) by mouth 2 (two) times daily as needed for muscle spasms. 03/01/15   Ladona MowJoe Younis Mathey, PA-C  ibuprofen (ADVIL,MOTRIN) 800 MG tablet Take 1 tablet (800 mg total) by mouth 3 (three) times daily. 02/02/14   Gerhard Munchobert Lockwood, MD  ibuprofen (ADVIL,MOTRIN) 800 MG tablet Take 1 tablet (800 mg total) by mouth 3 (three) times daily. 11/24/14   Courtney Forcucci, PA-C  ibuprofen (ADVIL,MOTRIN) 800 MG tablet Take 1 tablet (800 mg total) by mouth 3 (three) times daily. 03/01/15   Ladona MowJoe Dhruvi Crenshaw, PA-C   BP 120/47 mmHg  Pulse 66  Temp(Src) 98.5 F (36.9 C) (Oral)  Resp 16  Ht 6\' 3"  (1.905 m)  Wt 200 lb (90.719 kg)  BMI 25.00 kg/m2  SpO2 100% Physical Exam  Constitutional: He appears well-developed and well-nourished. No distress.  HENT:  Head: Normocephalic and atraumatic.  Eyes: Conjunctivae are normal. Right eye exhibits no discharge. Left eye exhibits no discharge. No scleral icterus.  Neck: Normal range of motion and full passive range of motion without pain. Neck supple. Muscular tenderness present. No spinous process tenderness present. No rigidity. Normal range of motion present.    Patient has full active and passive range of motion of neck with mild tenderness noted on lateral left side of neck. No C-spine tenderness. Patient's pain all mildly reproducible in left upper trapezius and right lower trapezius muscles, however pain is worst with range of motion.  Cardiovascular:  Peripheral pulses intact at injured extremity.   Pulmonary/Chest: Effort normal. No respiratory distress.  Musculoskeletal:  Cervical back: He exhibits no bony tenderness.       Thoracic back: Normal.       Lumbar back: Normal.  Neurological: He is alert. He has normal strength. No cranial nerve deficit or sensory deficit. He displays a negative Romberg sign. Gait normal. GCS eye subscore is 4. GCS verbal subscore is 5.  GCS motor subscore is 6.  Patient fully alert, answering questions appropriately in full, clear sentences. Cranial nerves II through XII grossly intact. Motor strength 5 out of 5 in all major muscle groups of upper and lower extremities. Distal sensation intact.  Skin: Skin is warm and dry. No rash noted. He is not diaphoretic.  Nursing note and vitals reviewed.   ED Course  Procedures (including critical care time) Labs Review Labs Reviewed - No data to display  Imaging Review Dg Cervical Spine Complete  03/01/2015   CLINICAL DATA:  Patient unloading truck with stack of bunk beds falling upon back of neck 2 days prior. Persistent pain.  EXAM: CERVICAL SPINE  4+ VIEWS  COMPARISON:  May 13, 2013  FINDINGS: Frontal, lateral, open-mouth odontoid, and bilateral oblique views were obtained. There is no fracture or spondylolisthesis. Prevertebral soft tissues and predental space regions are normal. Disc spaces appear intact. There is a small focus of calcification in the anterior ligament at C5-6. There is no appreciable facet arthropathy on the oblique views.  IMPRESSION: Minimal calcification in the anterior ligament at C5-6. No appreciable disc space narrowing or facet hypertrophy. No fracture or spondylolisthesis.   Electronically Signed   By: Bretta Bang III M.D.   On: 03/01/2015 16:29     EKG Interpretation None      MDM   Final diagnoses:  Cervical strain, initial encounter    Patient with neck pain.  No neurological deficits and normal neuro exam.  Patient with normal motor function of arms and legs.  Pt has full ROM of neck with lateral muscular pain only.  No concern for cervical fx.   No loss of bowel or bladder control.  No concern for cauda equina.  No fever, night sweats, weight loss, h/o cancer, IVDU.  RICE protocol and pain medicine indicated and discussed with patient. Return precautions discussed.  Recommended f/u with PCP.  Pt verbalized understanding and agreement of  plan.  Encouraged pt to call or return to the ER with any questions or concerns.    BP 120/47 mmHg  Pulse 66  Temp(Src) 98.5 F (36.9 C) (Oral)  Resp 16  Ht  (1.905 m)  Wt 200 lb (90.719 kg)  BMI 25.00 kg/m2  SpO2 100%  Signed,  Ladona Mow, PA-C 3:29 PM  Patient discussed with Dr. Mirian Mo, MD     Ladona Mow, PA-C 03/02/15 1529  Mirian Mo, MD 03/02/15 [phone]

## 2015-03-01 NOTE — ED Notes (Signed)
Pt left prior to receiving d/c instructions.

## 2015-11-21 IMAGING — CR DG CERVICAL SPINE COMPLETE 4+V
5 series · 5 of 5 positions shown · non-contrast
Comparison: May 13, 2013

CLINICAL DATA: Patient unloading truck with stack of bunk beds
falling upon back of neck 2 days prior. Persistent pain.

EXAM:
CERVICAL SPINE  4+ VIEWS

[w c-spine lat]
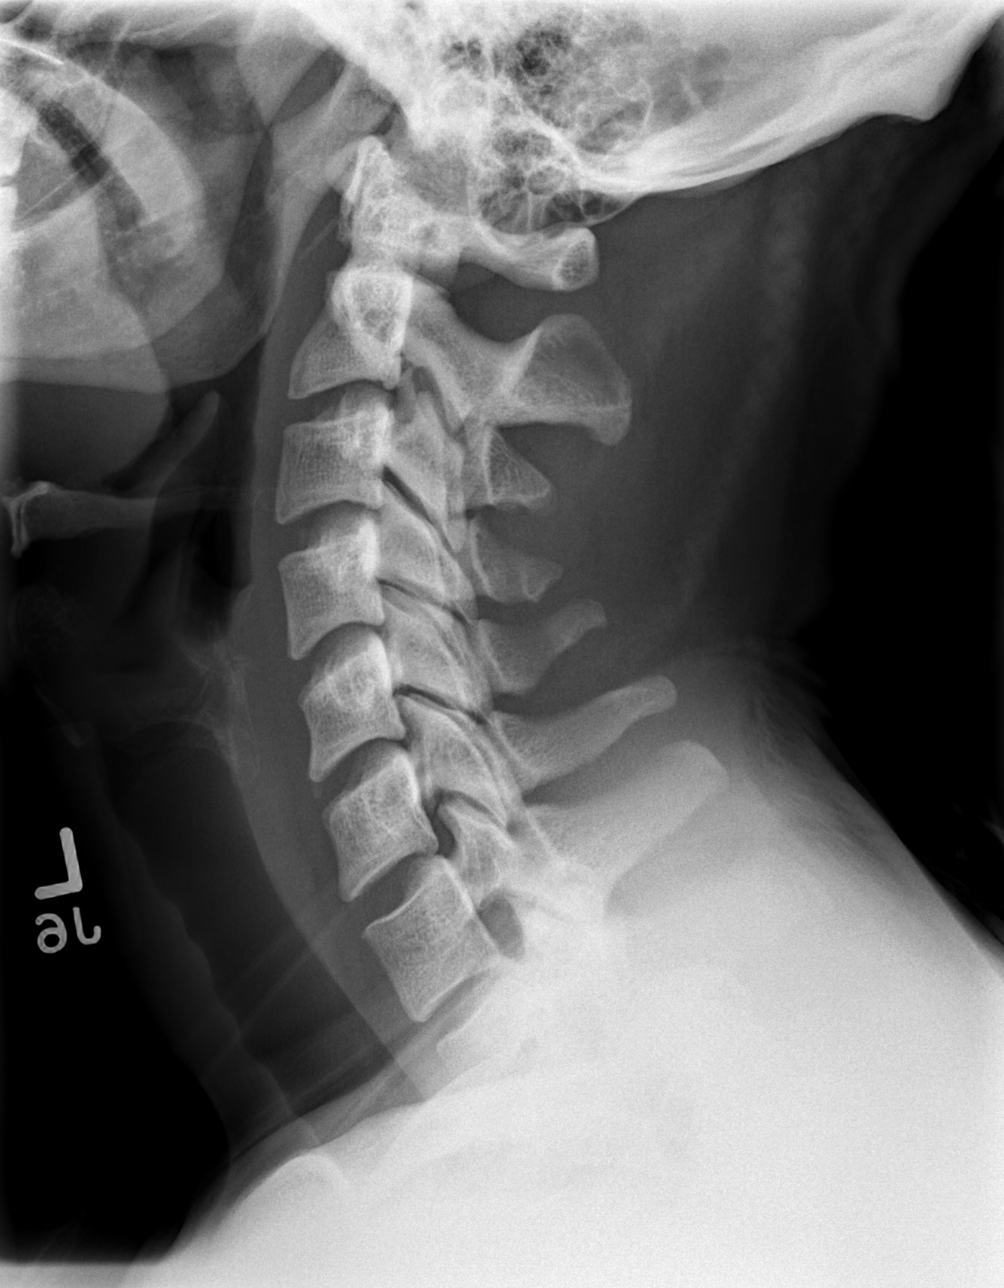

[w c-spine oblique (1 of 2)]
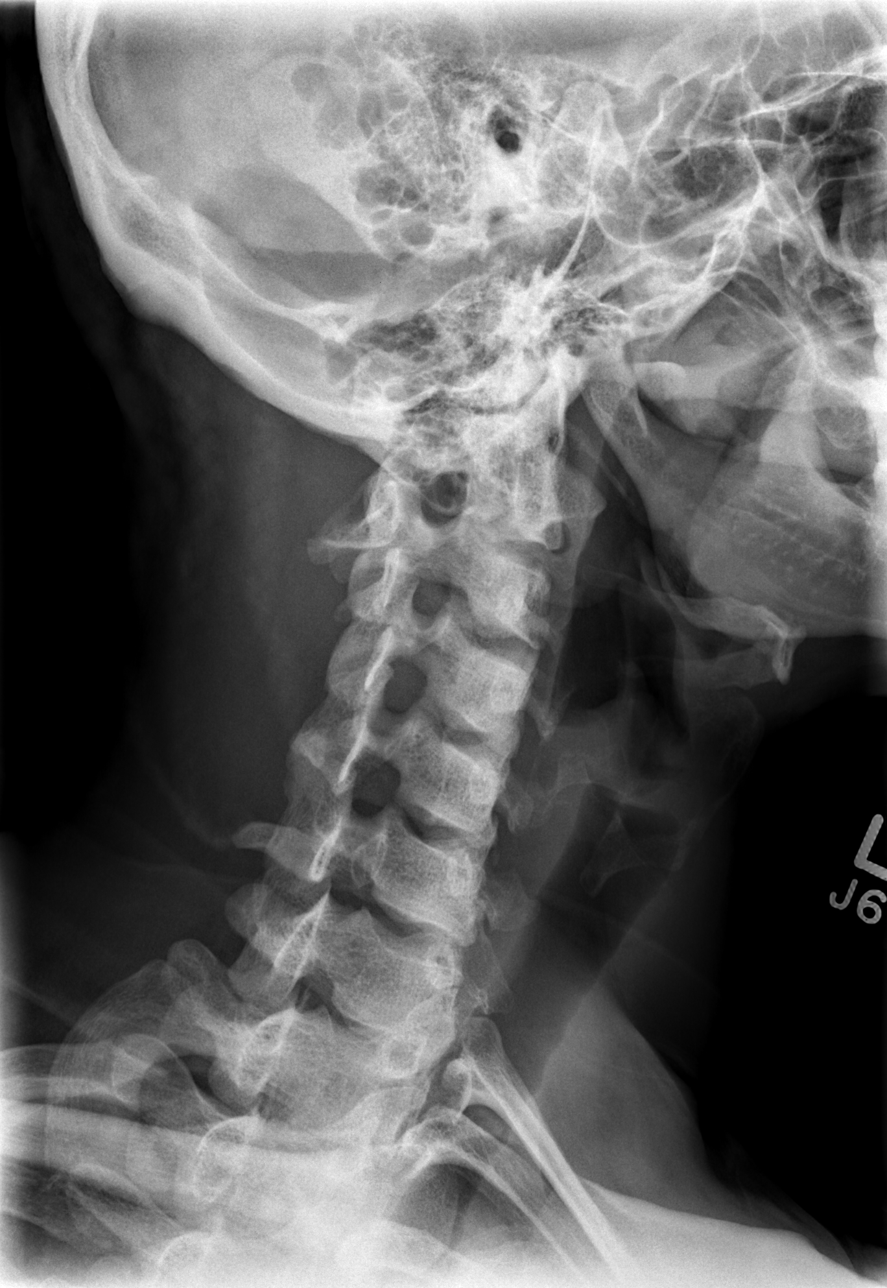

[w c-spine oblique (2 of 2)]
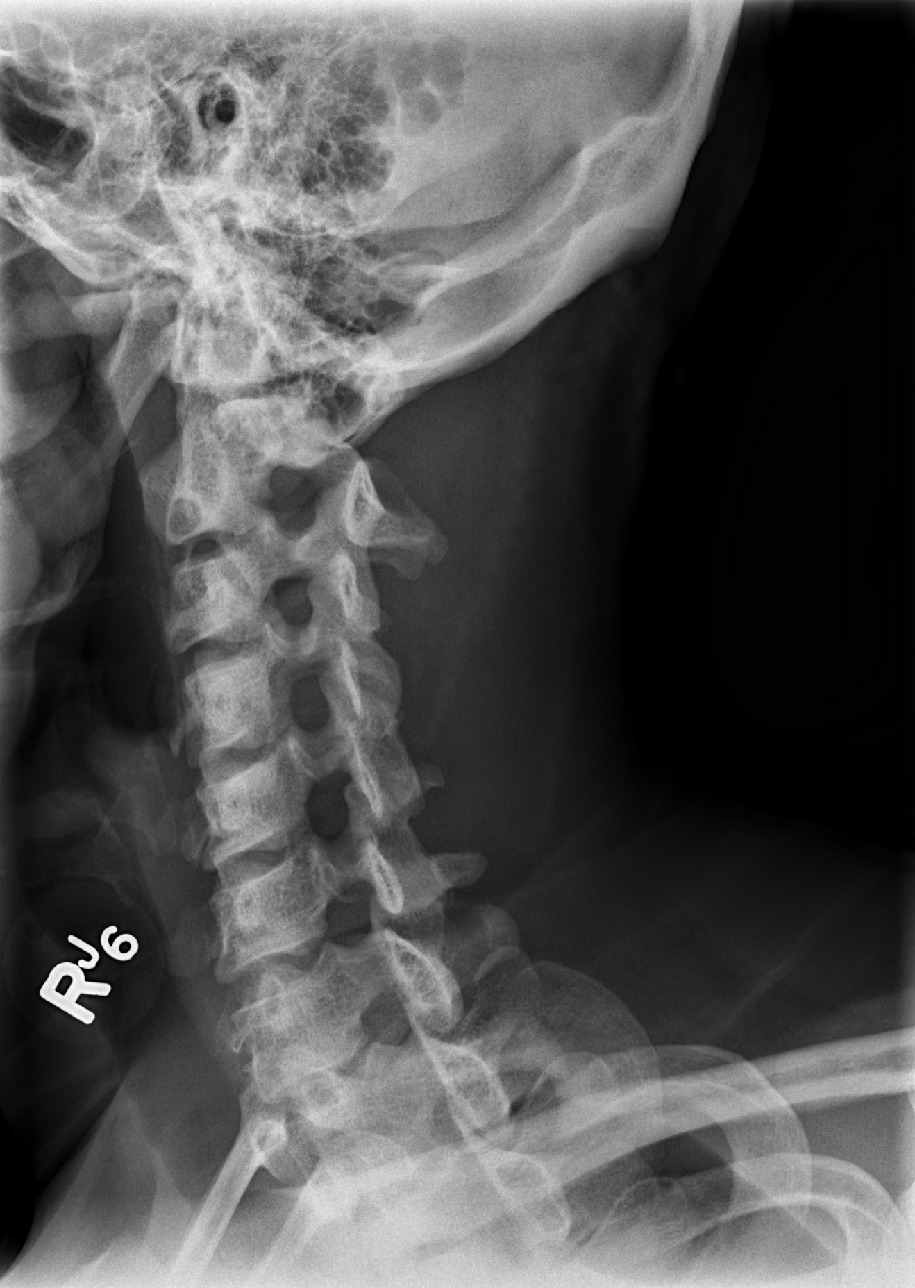

[w c-spine a.p.]
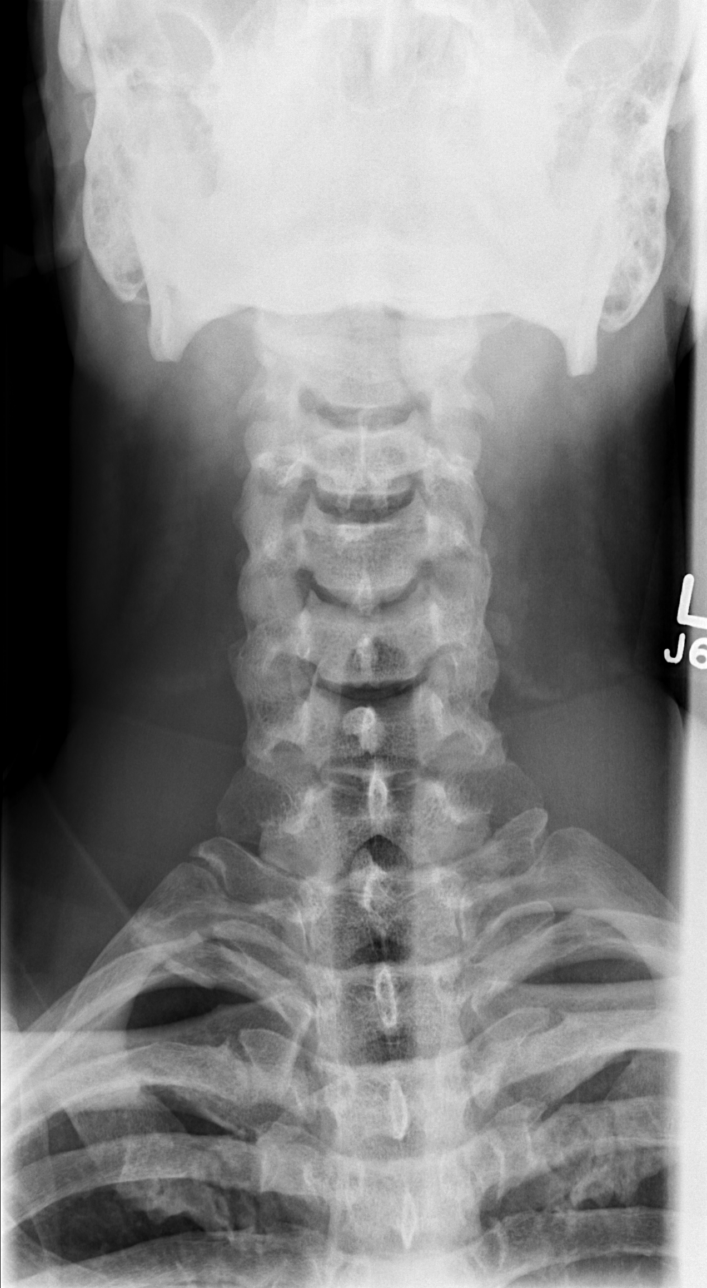

[w c-spine odontoid]
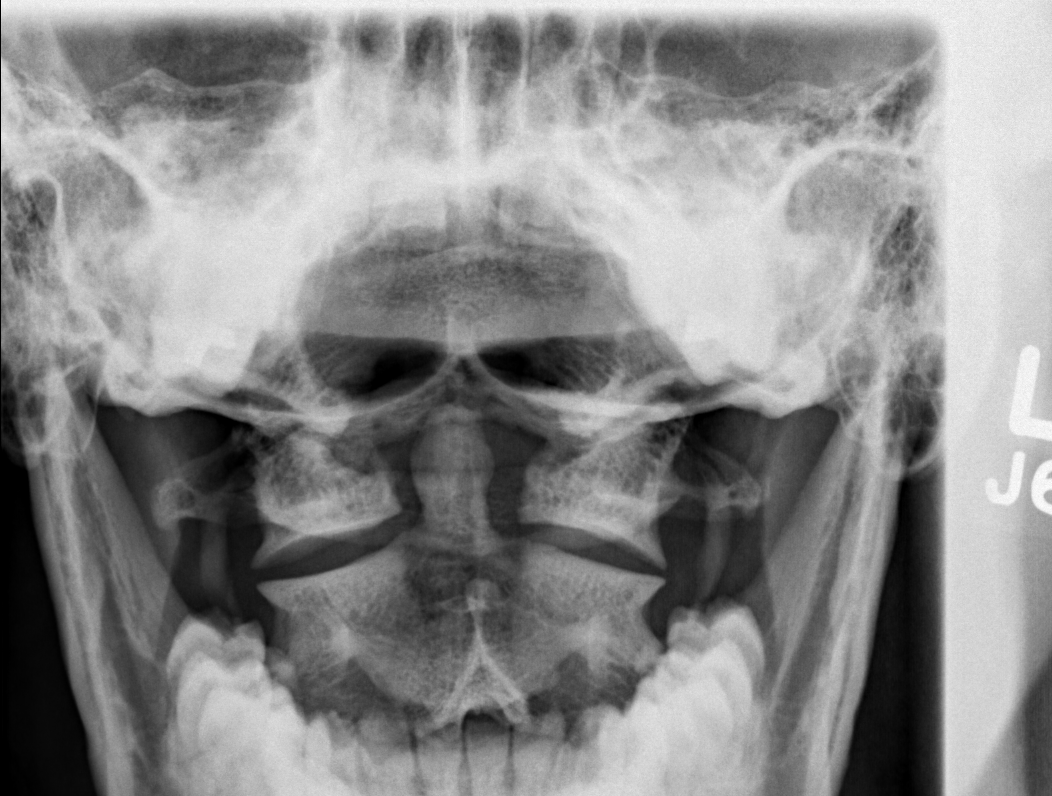

[5 of 5 positions shown; findings below may reference images not displayed]

FINDINGS: Frontal, lateral, open-mouth odontoid, and bilateral oblique views
were obtained. There is no fracture or spondylolisthesis.
Prevertebral soft tissues and predental space regions are normal.
Disc spaces appear intact. There is a small focus of calcification
in the anterior ligament at C5-6. There is no appreciable facet
arthropathy on the oblique views.
IMPRESSION: Minimal calcification in the anterior ligament at C5-6. No
appreciable disc space narrowing or facet hypertrophy. No fracture
or spondylolisthesis.

## 2020-09-23 ENCOUNTER — Encounter (HOSPITAL_BASED_OUTPATIENT_CLINIC_OR_DEPARTMENT_OTHER): Payer: Self-pay

## 2020-09-23 ENCOUNTER — Emergency Department (HOSPITAL_BASED_OUTPATIENT_CLINIC_OR_DEPARTMENT_OTHER)
Admission: EM | Admit: 2020-09-23 | Discharge: 2020-09-23 | Disposition: A | Discharge status: Home / Self Care | Payer: HRSA Program | Attending: Emergency Medicine | Admitting: Emergency Medicine

## 2020-09-23 ENCOUNTER — Other Ambulatory Visit: Payer: Self-pay

## 2020-09-23 DIAGNOSIS — F17210 Nicotine dependence, cigarettes, uncomplicated: Secondary | ICD-10-CM | POA: Diagnosis not present

## 2020-09-23 DIAGNOSIS — U071 COVID-19: Secondary | ICD-10-CM | POA: Insufficient documentation

## 2020-09-23 DIAGNOSIS — R059 Cough, unspecified: Secondary | ICD-10-CM | POA: Diagnosis present

## 2020-09-23 LAB — RESP PANEL BY RT PCR (RSV, FLU A&B, COVID)
Influenza A by PCR: NEGATIVE
Influenza B by PCR: NEGATIVE
Respiratory Syncytial Virus by PCR: NEGATIVE
SARS Coronavirus 2 by RT PCR: POSITIVE — AB

## 2020-09-23 MED ORDER — ONDANSETRON 4 MG PO TBDP: 4.0000 mg | ORAL_TABLET | Freq: Three times a day (TID) | ORAL | 0 refills | Status: AC | PRN

## 2020-09-23 MED FILL — ONDANSETRON ODT 4 MG TABLET: 4 | 4 days supply | Qty: 12 | Fill #0

## 2020-09-23 NOTE — ED Notes (Signed)
Call from lab, pt covid +, Dr Madilyn Hook notified of result.

## 2020-09-23 NOTE — ED Triage Notes (Signed)
Pt states cough, body ache, loss of taste, beginning 3 days ago.  States may have been exposed to covid through a friend.

## 2020-09-23 NOTE — ED Provider Notes (Signed)
[facility] EMERGENCY DEPARTMENT Provider Note   CSN: [id number] Arrival date & time: 09/23/20  0093     History Chief Complaint  Patient presents with  . Cough  . Generalized Body Aches    Bill Crawford. is a 39 y.o. male.  The history is provided by the patient.  Cough  Bill Crawford. is a 39 y.o. male who presents to the Emergency Department complaining of body aches and headache. He presents the emergency department complaining of scratchy throat that started three days ago followed by headaches, body aches, nasal congestion. Today he noticed that he had no taste when he went to brush his teeth. He has nausea and poor appetite. No significant cough. Denies any vomiting, abdominal pain. He does have mild diarrhea. He does have a positive COVID exposure through his friend. He has not been vaccinated for COVID-19. He has no known medical problems. He does smoke tobacco and he drinks alcohol daily. He has subjective fevers at home. Symptoms are moderate and constant in nature.    History reviewed. No pertinent past medical history.  There are no problems to display for this patient.   History reviewed. No pertinent surgical history.     History reviewed. No pertinent family history.  Social History   Tobacco Use  . Smoking status: Current Every Day Smoker    Packs/day: 0.50    Types: Cigarettes  . Smokeless tobacco: Never Used  Substance Use Topics  . Alcohol use: Yes    Comment: occasional  . Drug use: Yes    Types: Marijuana    Home Medications Prior to Admission medications   Medication Sig Start Date End Date Taking? Authorizing Provider  ibuprofen (ADVIL,MOTRIN) 800 MG tablet Take 1 tablet (800 mg total) by mouth 3 (three) times daily. 02/02/14  Yes Gerhard Munch, MD  cyclobenzaprine (FLEXERIL) 10 MG tablet Take 1 tablet (10 mg total) by mouth 2 (two) times daily as needed for muscle spasms. 03/01/15   Ladona Mow, PA-C  ibuprofen  (ADVIL,MOTRIN) 800 MG tablet Take 1 tablet (800 mg total) by mouth 3 (three) times daily. 11/24/14   Shirleen Schirmer, PA-C  ibuprofen (ADVIL,MOTRIN) 800 MG tablet Take 1 tablet (800 mg total) by mouth 3 (three) times daily. 03/01/15   Ladona Mow, PA-C  ondansetron (ZOFRAN ODT) 4 MG disintegrating tablet Take 1 tablet (4 mg total) by mouth every 8 (eight) hours as needed for nausea or vomiting. 09/23/20   Tilden Fossa, MD    Allergies    Patient has no known allergies.  Review of Systems   Review of Systems  Respiratory: Positive for cough.   All other systems reviewed and are negative.   Physical Exam Updated Vital Signs BP 120/74 (BP Location: Right Arm)   Pulse 78   Temp 99.3 F (37.4 C) (Oral)   Resp 18   Ht 6\' 3"  (1.905 m)   Wt 85.7 kg   SpO2 100%   BMI 23.62 kg/m   Physical Exam Vitals and nursing note reviewed.  Constitutional:      Appearance: He is well-developed.  HENT:     Head: Normocephalic and atraumatic.  Cardiovascular:     Rate and Rhythm: Normal rate and regular rhythm.     Heart sounds: No murmur heard.   Pulmonary:     Effort: Pulmonary effort is normal. No respiratory distress.     Breath sounds: Normal breath sounds.  Abdominal:     Palpations: Abdomen is soft.  Tenderness: There is no abdominal tenderness. There is no guarding or rebound.  Musculoskeletal:        General: No tenderness.  Skin:    General: Skin is warm and dry.  Neurological:     Mental Status: He is alert and oriented to person, place, and time.  Psychiatric:        Behavior: Behavior normal.     ED Results / Procedures / Treatments   Labs (all labs ordered are listed, but only abnormal results are displayed) Labs Reviewed  RESP PANEL BY RT PCR (RSV, FLU A&B, COVID) - Abnormal; Notable for the following components:      Result Value   SARS Coronavirus 2 by RT PCR POSITIVE (*)    All other components within normal limits    EKG None  Radiology No results  found.  Procedures Procedures (including critical care time)  Medications Ordered in ED Medications - No data to display  ED Course  I have reviewed the triage vital signs and the nursing notes.  Pertinent labs & imaging results that were available during my care of the patient were reviewed by me and considered in my medical decision making (see chart for details).    MDM Rules/Calculators/A&P                          Patient with recent exposure to COVID-19 here for evaluation of body aches, headaches, diarrhea and malaise. He is non-toxic appearing on evaluation and well hydrated. No respiratory distress. COVID-19 test is positive in the emergency department today. Discussed with patient home care for COVID-19 infection with symptomatic treatment. He is a Mab candidate due to history of tobacco use, alcohol use. He has been referred to the Mab infusion clinic. Discussed return precautions.  Baraa Scot Shiraishi. was evaluated in Emergency Department on 09/23/2020 for the symptoms described in the history of present illness. He was evaluated in the context of the global COVID-19 pandemic, which necessitated consideration that the patient might be at risk for infection with the SARS-CoV-2 virus that causes COVID-19. Institutional protocols and algorithms that pertain to the evaluation of patients at risk for COVID-19 are in a state of rapid change based on information released by regulatory bodies including the CDC and federal and state organizations. These policies and algorithms were followed during the patient's care in the ED.  Final Clinical Impression(s) / ED Diagnoses Final diagnoses:  COVID-19 virus infection    Rx / DC Orders ED Discharge Orders         Ordered    ondansetron (ZOFRAN ODT) 4 MG disintegrating tablet  Every 8 hours PRN        09/23/20 1008           Tilden Fossa, MD 09/23/20 1020

## 2020-09-24 ENCOUNTER — Telehealth: Payer: Self-pay | Admitting: Family

## 2020-09-24 NOTE — Telephone Encounter (Signed)
Called to Discuss with patient about Covid symptoms and the use of the monoclonal antibody infusion for those with mild to moderate Covid symptoms and at a high risk of hospitalization.     Pt appears to qualify for this infusion due to co-morbid conditions and/or a member of an at-risk group in accordance with the FDA Emergency Use Authorization.    Bill Crawford was seen in the [facility] ED on 09/23/2020 with body aches, headaches, and nasal congestion starting on 09/20/2020.  Qualifying risk factors include high social vulnerability risk score and tobacco use. COVID testing was positive.   Spoke with Bill Crawford regarding treatment with monoclonal antibody treatment. He spoke with someone yesterday and continues to decline treatment at this time. Hotline number provided should he change his mind.   Bill Eke, NP 09/24/2020 9:44 AM

## 2021-01-31 ENCOUNTER — Other Ambulatory Visit: Payer: Self-pay

## 2021-01-31 ENCOUNTER — Emergency Department (HOSPITAL_BASED_OUTPATIENT_CLINIC_OR_DEPARTMENT_OTHER)
Admission: EM | Admit: 2021-01-31 | Discharge: 2021-01-31 | Disposition: A | Discharge status: Home / Self Care | Payer: Self-pay | Attending: Emergency Medicine | Admitting: Emergency Medicine

## 2021-01-31 DIAGNOSIS — F17210 Nicotine dependence, cigarettes, uncomplicated: Secondary | ICD-10-CM | POA: Insufficient documentation

## 2021-01-31 DIAGNOSIS — L98499 Non-pressure chronic ulcer of skin of other sites with unspecified severity: Secondary | ICD-10-CM | POA: Insufficient documentation

## 2021-01-31 DIAGNOSIS — L918 Other hypertrophic disorders of the skin: Secondary | ICD-10-CM | POA: Insufficient documentation

## 2021-01-31 MED ORDER — SULFAMETHOXAZOLE-TRIMETHOPRIM 800-160 MG PO TABS: 1.0000 | ORAL_TABLET | Freq: Two times a day (BID) | ORAL | 0 refills | Status: AC

## 2021-01-31 NOTE — ED Triage Notes (Signed)
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 . Pt stated the skin tag has been there over 10 years but has never bothered him before.

## 2021-01-31 NOTE — ED Provider Notes (Signed)
[facility] EMERGENCY DEPARTMENT Provider Note   CSN: [id number] Arrival date & time: 01/31/21  1557     History Chief Complaint  Patient presents with  . Skin Ulcer    Bill Ivey Cina. is a 40 y.o. male.   Presents with concern for skin tag pain. States that he has had a skin tag for at least 10 years, normally does not cause any pain or problems. 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 Relatively constant, worse with movement, improved with warm compresses. No fevers.  Denies any chronic medical problems.  HPI     No past medical history on file.  There are no problems to display for this patient.   No past surgical history on file.     No family history on file.  Social History   Tobacco Use  . Smoking status: Current Every Day Smoker    Packs/day: 0.50    Types: Cigarettes  . Smokeless tobacco: Never Used  Substance Use Topics  . Alcohol use: Yes    Comment: occasional  . Drug use: Yes    Types: Marijuana    Home Medications Prior to Admission medications   Medication Sig Start Date End Date Taking? Authorizing Provider  sulfamethoxazole-trimethoprim (BACTRIM DS) 800-160 MG tablet Take 1 tablet by mouth 2 (two) times daily for 7 days. 01/31/21 02/07/21 Yes Adna Nofziger, Quitman Livings, MD  cyclobenzaprine (FLEXERIL) 10 MG tablet Take 1 tablet (10 mg total) by mouth 2 (two) times daily as needed for muscle spasms. 03/01/15   Ladona Mow, PA-C  ibuprofen (ADVIL,MOTRIN) 800 MG tablet Take 1 tablet (800 mg total) by mouth 3 (three) times daily. 02/02/14   Gerhard Munch, MD  ibuprofen (ADVIL,MOTRIN) 800 MG tablet Take 1 tablet (800 mg total) by mouth 3 (three) times daily. 11/24/14   Shirleen Schirmer, PA-C  ibuprofen (ADVIL,MOTRIN) 800 MG tablet Take 1 tablet (800 mg total) by mouth 3 (three) times daily. 03/01/15   Ladona Mow, PA-C  ondansetron (ZOFRAN ODT) 4 MG  disintegrating tablet Take 1 tablet (4 mg total) by mouth every 8 (eight) hours as needed for nausea or vomiting. 09/23/20   Tilden Fossa, MD    Allergies    Patient has no known allergies.  Review of Systems   Review of Systems  Constitutional: Negative for chills and fever.  HENT: Negative for ear pain and sore throat.   Eyes: Negative for pain and visual disturbance.  Respiratory: Negative for cough and shortness of breath.   Cardiovascular: Negative for chest pain and palpitations.  Gastrointestinal: Negative for abdominal pain and vomiting.  Genitourinary: Negative for dysuria and hematuria.  Musculoskeletal: Positive for back pain. Negative for arthralgias.  Skin: Positive for wound. Negative for color change and rash.  Neurological: Negative for seizures and syncope.  All other systems reviewed and are negative.   Physical Exam Updated Vital Signs BP 135/77 (BP Location: Right Arm)   Pulse 76   Temp 99.6 F (37.6 C) (Oral)   Resp 14   Ht 6\' 3"  (1.905 m)   Wt 86.2 kg   SpO2 100%   BMI 23.75 kg/m   Physical Exam Vitals and nursing note reviewed.  Constitutional:      Appearance: He is well-developed and well-nourished.  HENT:     Head: Normocephalic and atraumatic.  Eyes:     Conjunctiva/sclera: Conjunctivae normal.  Cardiovascular:  Rate and Rhythm: Normal rate.     Pulses: Normal pulses.  Pulmonary:     Effort: Pulmonary effort is normal. No respiratory distress.  Abdominal:     Palpations: Abdomen is soft.     Tenderness: There is no abdominal tenderness.  Musculoskeletal:        General: No edema.     Cervical back: Neck supple.  Skin:    General: Skin is warm and dry.     Comments: Upper back: 1cm diameter skin tag over upper thoracic region, there is a minimal amount of erythema at base with some tenderness on palpation but no induration  Neurological:     Mental Status: He is alert.  Psychiatric:        Mood and Affect: Mood and affect  normal.     ED Results / Procedures / Treatments   Labs (all labs ordered are listed, but only abnormal results are displayed) Labs Reviewed - No data to display  EKG None  Radiology No results found.  Procedures Procedures   Medications Ordered in ED Medications - No data to display  ED Course  I have reviewed the triage vital signs and the nursing notes.  Pertinent labs & imaging results that were available during my care of the patient were reviewed by me and considered in my medical decision making (see chart for details).    MDM Rules/Calculators/A&P                         40 year old male presents ER with concern for pain around site of a chronic skin tag. On exam, the skin tag certainly appears chronic but there was a very mild amount of redness at the base. Suspect most likely some irritation from minor trauma.  Low suspicion for abscess or cellulitis however given this redness, will cover with antibiotics. Recommend that he follow-up with his primary doctor, additionally provided information for dermatology if he wishes to have this removed. Reviewed return precautions and discharged home.   After the discussed management above, the patient was determined to be safe for discharge.  The patient was in agreement with this plan and all questions regarding their care were answered.  ED return precautions were discussed and the patient will return to the ED with any significant worsening of condition.  Final Clinical Impression(s) / ED Diagnoses Final diagnoses:  Skin tag    Rx / DC Orders ED Discharge Orders         Ordered    sulfamethoxazole-trimethoprim (BACTRIM DS) 800-160 MG tablet  2 times daily        01/31/21 1728           Milagros Loll, MD 01/31/21 2127

## 2021-01-31 NOTE — Discharge Instructions (Signed)
Please take antibiotic as prescribed.  Please follow-up with your primary care doctor and dermatology.  You should be evaluated ideally within the next 2 to 3 days for a close recheck.  Continue warm compresses.  If your area of pain worsens, swelling worsens or redness worsens, return to ER for reassessment.

## 2024-02-18 ENCOUNTER — Other Ambulatory Visit: Payer: Self-pay | Admitting: Surgery

## 2024-02-18 DIAGNOSIS — R1012 Left upper quadrant pain: Secondary | ICD-10-CM

## 2024-02-20 ENCOUNTER — Ambulatory Visit
Admission: RE | Admit: 2024-02-20 | Discharge: 2024-02-20 | Disposition: A | Discharge status: Home / Self Care | Source: Ambulatory Visit | Attending: Surgery | Admitting: Surgery

## 2024-02-20 DIAGNOSIS — R1012 Left upper quadrant pain: Secondary | ICD-10-CM

## 2024-02-20 MED ORDER — IOPAMIDOL (ISOVUE-300) INJECTION 61%
100.0000 mL | Freq: Once | INTRAVENOUS | Status: AC | PRN
  Administered 2024-02-20: 100 mL via INTRAVENOUS

## 2024-02-22 ENCOUNTER — Encounter: Payer: Self-pay | Admitting: Surgery

## 2024-02-22 NOTE — Progress Notes (Signed)
 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  However, an MRI with and without contrast is recommended by the radiologist.  Also, there is a vague lesion in the lung which may or may not represent a nodule.  CT of chest with contrast is recommended.  Tresa Endo - please arrange for MRI of abdomen with and without contrast to evaluate 2.8 cm splenic mass.  Also arrange CT chest with contrast to evaluate possible pulmonary nodule.  Darnell Level, MD [facility] practice Office: [phone]

## 2024-03-03 ENCOUNTER — Other Ambulatory Visit: Payer: Self-pay | Admitting: Surgery

## 2024-03-03 DIAGNOSIS — R161 Splenomegaly, not elsewhere classified: Secondary | ICD-10-CM

## 2024-03-06 ENCOUNTER — Ambulatory Visit
Admission: RE | Admit: 2024-03-06 | Discharge: 2024-03-06 | Disposition: A | Discharge status: Home / Self Care | Source: Ambulatory Visit | Attending: Surgery | Admitting: Surgery

## 2024-03-06 DIAGNOSIS — R161 Splenomegaly, not elsewhere classified: Secondary | ICD-10-CM

## 2024-03-06 MED ORDER — GADOPICLENOL 0.5 MMOL/ML IV SOLN
9.0000 mL | Freq: Once | INTRAVENOUS | Status: AC | PRN
  Administered 2024-03-06: 9 mL via INTRAVENOUS

## 2024-03-11 ENCOUNTER — Encounter: Payer: Self-pay | Admitting: Surgery

## 2024-03-11 NOTE — Progress Notes (Signed)
 MRI and CT results reviewed.  I have forwarded to Dr. Phylliss Blakes for consideration for laparoscopic or robotic splenectomy.  Darnell Level, MD [facility] practice Office: [phone]
